# Patient Record
Sex: Male | Born: 1960 | Race: Black or African American | Hispanic: No | Smoking: Current every day smoker
Health system: Southern US, Community
[De-identification: ages and names within clinical notes are randomized; demographics above are authoritative.]

## PROBLEM LIST (undated history)

## (undated) DIAGNOSIS — M1990 Unspecified osteoarthritis, unspecified site: Secondary | ICD-10-CM

## (undated) DIAGNOSIS — E7800 Pure hypercholesterolemia, unspecified: Secondary | ICD-10-CM

## (undated) DIAGNOSIS — I10 Essential (primary) hypertension: Secondary | ICD-10-CM

## (undated) DIAGNOSIS — I639 Cerebral infarction, unspecified: Secondary | ICD-10-CM

## (undated) DIAGNOSIS — J45909 Unspecified asthma, uncomplicated: Secondary | ICD-10-CM

## (undated) DIAGNOSIS — Z8673 Personal history of transient ischemic attack (TIA), and cerebral infarction without residual deficits: Secondary | ICD-10-CM

## (undated) DIAGNOSIS — T7840XA Allergy, unspecified, initial encounter: Secondary | ICD-10-CM

## (undated) DIAGNOSIS — E119 Type 2 diabetes mellitus without complications: Secondary | ICD-10-CM

## (undated) HISTORY — DX: Allergy, unspecified, initial encounter: T78.40XA

## (undated) HISTORY — DX: Unspecified asthma, uncomplicated: J45.909

## (undated) HISTORY — PX: BACK SURGERY: SHX140

## (undated) HISTORY — DX: Essential (primary) hypertension: I10

## (undated) HISTORY — DX: Personal history of transient ischemic attack (TIA), and cerebral infarction without residual deficits: Z86.73

## (undated) HISTORY — DX: Unspecified osteoarthritis, unspecified site: M19.90

## (undated) HISTORY — PX: CYSTECTOMY: SUR359

---
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
## 2001-08-22 ENCOUNTER — Emergency Department (HOSPITAL_COMMUNITY): Admission: EM | Admit: 2001-08-22 | Discharge: 2001-08-22 | Payer: Self-pay | Admitting: Emergency Medicine

## 2001-08-22 ENCOUNTER — Encounter: Payer: Self-pay | Admitting: Emergency Medicine

## 2015-10-28 ENCOUNTER — Emergency Department (HOSPITAL_BASED_OUTPATIENT_CLINIC_OR_DEPARTMENT_OTHER): Payer: Managed Care, Other (non HMO)

## 2015-10-28 ENCOUNTER — Encounter (HOSPITAL_BASED_OUTPATIENT_CLINIC_OR_DEPARTMENT_OTHER): Payer: Self-pay | Admitting: *Deleted

## 2015-10-28 ENCOUNTER — Emergency Department (HOSPITAL_BASED_OUTPATIENT_CLINIC_OR_DEPARTMENT_OTHER)
Admission: EM | Admit: 2015-10-28 | Discharge: 2015-10-28 | Disposition: A | Discharge status: Home / Self Care | Payer: Managed Care, Other (non HMO) | Attending: Emergency Medicine | Admitting: Emergency Medicine

## 2015-10-28 DIAGNOSIS — G629 Polyneuropathy, unspecified: Secondary | ICD-10-CM | POA: Diagnosis not present

## 2015-10-28 DIAGNOSIS — Y9289 Other specified places as the place of occurrence of the external cause: Secondary | ICD-10-CM | POA: Insufficient documentation

## 2015-10-28 DIAGNOSIS — Y998 Other external cause status: Secondary | ICD-10-CM | POA: Insufficient documentation

## 2015-10-28 DIAGNOSIS — M7989 Other specified soft tissue disorders: Secondary | ICD-10-CM | POA: Diagnosis present

## 2015-10-28 DIAGNOSIS — S838X1A Sprain of other specified parts of right knee, initial encounter: Secondary | ICD-10-CM

## 2015-10-28 DIAGNOSIS — Z760 Encounter for issue of repeat prescription: Secondary | ICD-10-CM | POA: Insufficient documentation

## 2015-10-28 DIAGNOSIS — X58XXXA Exposure to other specified factors, initial encounter: Secondary | ICD-10-CM | POA: Insufficient documentation

## 2015-10-28 DIAGNOSIS — S83206A Unspecified tear of unspecified meniscus, current injury, right knee, initial encounter: Secondary | ICD-10-CM | POA: Insufficient documentation

## 2015-10-28 DIAGNOSIS — Z7984 Long term (current) use of oral hypoglycemic drugs: Secondary | ICD-10-CM | POA: Diagnosis not present

## 2015-10-28 DIAGNOSIS — E7800 Pure hypercholesterolemia, unspecified: Secondary | ICD-10-CM | POA: Insufficient documentation

## 2015-10-28 DIAGNOSIS — F17210 Nicotine dependence, cigarettes, uncomplicated: Secondary | ICD-10-CM | POA: Diagnosis not present

## 2015-10-28 DIAGNOSIS — E1140 Type 2 diabetes mellitus with diabetic neuropathy, unspecified: Secondary | ICD-10-CM | POA: Insufficient documentation

## 2015-10-28 DIAGNOSIS — Y9389 Activity, other specified: Secondary | ICD-10-CM | POA: Diagnosis not present

## 2015-10-28 DIAGNOSIS — I10 Essential (primary) hypertension: Secondary | ICD-10-CM

## 2015-10-28 HISTORY — DX: Pure hypercholesterolemia, unspecified: E78.00

## 2015-10-28 HISTORY — DX: Essential (primary) hypertension: I10

## 2015-10-28 HISTORY — DX: Type 2 diabetes mellitus without complications: E11.9

## 2015-10-28 LAB — CBC WITH DIFFERENTIAL/PLATELET
Basophils Absolute: 0 10*3/uL (ref 0.0–0.1)
Basophils Relative: 1 %
Eosinophils Absolute: 0.1 10*3/uL (ref 0.0–0.7)
Eosinophils Relative: 2 %
HCT: 38.1 % — ABNORMAL LOW (ref 39.0–52.0)
Hemoglobin: 12.7 g/dL — ABNORMAL LOW (ref 13.0–17.0)
Lymphocytes Relative: 33 %
Lymphs Abs: 2 10*3/uL (ref 0.7–4.0)
MCH: 23.7 pg — ABNORMAL LOW (ref 26.0–34.0)
MCHC: 33.3 g/dL (ref 30.0–36.0)
MCV: 71.1 fL — ABNORMAL LOW (ref 78.0–100.0)
Monocytes Absolute: 0.8 10*3/uL (ref 0.1–1.0)
Monocytes Relative: 13 %
Neutro Abs: 3.1 10*3/uL (ref 1.7–7.7)
Neutrophils Relative %: 51 %
Platelets: 275 10*3/uL (ref 150–400)
RBC: 5.36 MIL/uL (ref 4.22–5.81)
RDW: 15.2 % (ref 11.5–15.5)
WBC: 6 10*3/uL (ref 4.0–10.5)

## 2015-10-28 LAB — COMPREHENSIVE METABOLIC PANEL
ALT: 19 U/L (ref 17–63)
AST: 22 U/L (ref 15–41)
Albumin: 3.6 g/dL (ref 3.5–5.0)
Alkaline Phosphatase: 50 U/L (ref 38–126)
Anion gap: 10 (ref 5–15)
BUN: 14 mg/dL (ref 6–20)
CO2: 21 mmol/L — ABNORMAL LOW (ref 22–32)
Calcium: 8.9 mg/dL (ref 8.9–10.3)
Chloride: 105 mmol/L (ref 101–111)
Creatinine, Ser: 0.87 mg/dL (ref 0.61–1.24)
GFR calc Af Amer: >60 mL/min (ref >60)
GFR calc non Af Amer: >60 mL/min (ref >60)
Glucose, Bld: 102 mg/dL — ABNORMAL HIGH (ref 65–99)
Potassium: 4 mmol/L (ref 3.5–5.1)
Sodium: 136 mmol/L (ref 135–145)
Total Bilirubin: 0.3 mg/dL (ref 0.3–1.2)
Total Protein: 7 g/dL (ref 6.5–8.1)

## 2015-10-28 MED ORDER — AMLODIPINE BESYLATE 5 MG PO TABS: 5.0000 mg | ORAL_TABLET | Freq: Every day | ORAL | Status: DC

## 2015-10-28 MED ORDER — MELOXICAM 7.5 MG PO TABS: 7.5000 mg | ORAL_TABLET | Freq: Every day | ORAL | Status: DC

## 2015-10-28 MED ORDER — ROSUVASTATIN CALCIUM 10 MG PO TABS: 10.0000 mg | ORAL_TABLET | Freq: Every day | ORAL | Status: DC

## 2015-10-28 MED ORDER — METFORMIN HCL 500 MG PO TABS
500.0000 mg | ORAL_TABLET | Freq: Two times a day (BID) | ORAL | Status: DC
Start: 2015-10-28 — End: 2019-01-21

## 2015-10-28 MED ORDER — HYDROCODONE-ACETAMINOPHEN 5-325 MG PO TABS
1.0000 | ORAL_TABLET | Freq: Once | ORAL | Status: AC
  Administered 2015-10-28: 1 via ORAL
  Filled 2015-10-28: qty 1

## 2015-10-28 MED ORDER — GABAPENTIN 400 MG PO CAPS: 400.0000 mg | ORAL_CAPSULE | Freq: Three times a day (TID) | ORAL | Status: DC

## 2015-10-28 MED ORDER — LISINOPRIL 40 MG PO TABS: 40.0000 mg | ORAL_TABLET | Freq: Every day | ORAL | Status: DC

## 2015-10-28 NOTE — Discharge Instructions (Signed)
Hypertension Hypertension is another name for high blood pressure. High blood pressure forces your heart to work harder to pump blood. A blood pressure reading has two numbers, which includes a higher number over a lower number (example: 110/72). HOME CARE   Have your blood pressure rechecked by your doctor.  Only take medicine as told by your doctor. Follow the directions carefully. The medicine does not work as well if you skip doses. Skipping doses also puts you at risk for problems.  Do not smoke.  Monitor your blood pressure at home as told by your doctor. GET HELP IF:  You think you are having a reaction to the medicine you are taking.  You have repeat headaches or feel dizzy.  You have puffiness (swelling) in your ankles.  You have trouble with your vision. GET HELP RIGHT AWAY IF:   You get a very bad headache and are confused.  You feel weak, numb, or faint.  You get chest or belly (abdominal) pain.  You throw up (vomit).  You cannot breathe very well. MAKE SURE YOU:   Understand these instructions.  Will watch your condition.  Will get help right away if you are not doing well or get worse.   This information is not intended to replace advice given to you by your health care provider. Make sure you discuss any questions you have with your health care provider.   Document Released: 12/08/2007 Document Revised: 06/26/2013 Document Reviewed: 04/13/2013 Elsevier Interactive Patient Education 2016 Elsevier Inc.  Knee Sprain A knee sprain is a tear in the strong bands of tissue that connect the bones (ligaments) of your knee. HOME CARE  Raise (elevate) your injured knee to lessen puffiness (swelling).  To ease pain and puffiness, put ice on the injured area.  Put ice in a plastic bag.  Place a towel between your skin and the bag.  Leave the ice on for 20 minutes, 2-3 times a day.  Only take medicine as told by your doctor.  Do not leave your knee  unprotected until pain and stiffness go away (usually 4-6 weeks).  If you have a cast or splint, do not get it wet. If your doctor told you to not take it off, cover it with a plastic bag when you shower or bathe. Do not swim.  Your doctor may have you do exercises to prevent or limit permanent weakness and stiffness. GET HELP RIGHT AWAY IF:   Your cast or splint becomes damaged.  Your pain gets worse.  You have a lot of pain, puffiness, or numbness below the cast or splint. MAKE SURE YOU:   Understand these instructions.  Will watch your condition.  Will get help right away if you are not doing well or get worse.   This information is not intended to replace advice given to you by your health care provider. Make sure you discuss any questions you have with your health care provider.   Document Released: 06/09/2009 Document Revised: 06/26/2013 Document Reviewed: 02/27/2013 Elsevier Interactive Patient Education [employer]2016 Elsevier Inc.

## 2015-10-28 NOTE — ED Notes (Signed)
Swelling and pain to his left foot, ankle and lower leg. No injury. He has some swelling of his right lower leg as a result of injury 2 weeks ago when he was trotting to catch the bus.

## 2015-10-28 NOTE — ED Provider Notes (Signed)
CSN: [id number]     Arrival date & time 10/28/15  1546 History   First MD Initiated Contact with Patient 10/28/15 1606     Chief Complaint  Patient presents with  . Leg Swelling    HPI  Patient presents for evaluation complaining of a right knee pain after an injury. Swelling swelling, and pain in both legs. All Foley becomes evident that he's had a long history of high blood pressure and diabetes and peripheral neuropathy. This was not evident initially until he was able to produce old medication bottles and has been previously on Neurontin. He states he denies medicines for week after [place] here [place]. Has not been able to establish health care insurance, her primary care physician locally. Also states that he twisted his knee when attempting to run for a bus 2 weeks comes had some pain on the medial aspect of his right knee, and a lip since that time.  Past Medical History  Diagnosis Date  . High cholesterol   . Hypertension   . Diabetes mellitus without complication [facility](HCC)    Past Surgical History  Procedure Laterality Date  . Back surgery     No family history on file. Social History  Substance Use Topics  . Smoking status: Current Every Day Smoker    Types: Cigarettes  . Smokeless tobacco: None  . Alcohol Use: Yes     Comment: weekly    Review of Systems  Constitutional: Negative for fever, chills, diaphoresis, appetite change and fatigue.  HENT: Negative for mouth sores, sore throat and trouble swallowing.   Eyes: Negative for visual disturbance.  Respiratory: Negative for cough, chest tightness, shortness of breath and wheezing.   Cardiovascular: Positive for leg swelling. Negative for chest pain.  Gastrointestinal: Negative for nausea, vomiting, abdominal pain, diarrhea and abdominal distention.  Endocrine: Negative for polydipsia, polyphagia and polyuria.  Genitourinary: Negative for dysuria, frequency and hematuria.  Musculoskeletal: Positive for joint  swelling, arthralgias and gait problem.  Skin: Negative for color change, pallor and rash.  Neurological: Negative for dizziness, syncope, light-headedness and headaches.  Hematological: Does not bruise/bleed easily.  Psychiatric/Behavioral: Negative for behavioral problems and confusion.      Allergies  Review of patient's allergies indicates no known allergies.  Home Medications   Prior to Admission medications   Medication Sig Start Date End Date Taking? Authorizing Provider  amLODipine (NORVASC) 5 MG tablet Take 1 tablet (5 mg total) by mouth daily. 10/28/15   Rolland PorterMark Dayan Desa, MD  gabapentin (NEURONTIN) 400 MG capsule Take 1 capsule (400 mg total) by mouth 3 (three) times daily. 10/28/15   Rolland PorterMark Trygg Mantz, MD  lisinopril (PRINIVIL,ZESTRIL) 40 MG tablet Take 1 tablet (40 mg total) by mouth daily. 10/28/15   Rolland PorterMark Verna Hamon, MD  meloxicam (MOBIC) 7.5 MG tablet Take 1 tablet (7.5 mg total) by mouth daily. 10/28/15   Rolland PorterMark Fable Huisman, MD  metFORMIN (GLUCOPHAGE) 500 MG tablet Take 1 tablet (500 mg total) by mouth 2 (two) times daily with a meal. 10/28/15   Rolland PorterMark Jalaina Salyers, MD  rosuvastatin (CRESTOR) 10 MG tablet Take 1 tablet (10 mg total) by mouth daily. 10/28/15   Rolland PorterMark Brody Bonneau, MD   BP 104/75 mmHg  Pulse 87  Temp(Src) 98.3 F (36.8 C) (Oral)  Resp 20  Ht 5' 11.5" (1.816 m)  Wt 238 lb (107.956 kg)  BMI 32.74 kg/m2  SpO2 99% Physical Exam  Constitutional: He is oriented to person, place, and time. He appears well-developed and well-nourished. No distress.  HENT:  Head: Normocephalic.  Eyes: Conjunctivae are normal. Pupils are equal, round, and reactive to light. No scleral icterus.  Neck: Normal range of motion. Neck supple. No thyromegaly present.  Cardiovascular: Normal rate and regular rhythm.  Exam reveals no gallop and no friction rub.   No murmur heard. Pulmonary/Chest: Effort normal and breath sounds normal. No respiratory distress. He has no wheezes. He has no rales.  Abdominal: Soft. Bowel sounds are  normal. He exhibits no distension. There is no tenderness. There is no rebound.  Musculoskeletal: Normal range of motion.       Legs: Neurological: He is alert and oriented to person, place, and time.  Skin: Skin is warm and dry. No rash noted.  Psychiatric: He has a normal mood and affect. His behavior is normal.    ED Course  Procedures (including critical care time) Labs Review Labs Reviewed  CBC WITH DIFFERENTIAL/PLATELET - Abnormal; Notable for the following:    Hemoglobin 12.7 (*)    HCT 38.1 (*)    MCV 71.1 (*)    MCH 23.7 (*)    All other components within normal limits  COMPREHENSIVE METABOLIC PANEL - Abnormal; Notable for the following:    CO2 21 (*)    Glucose, Bld 102 (*)    All other components within normal limits    Imaging Review Dg Knee Complete 4 Views Right  10/28/2015  CLINICAL DATA:  The patient felt a pop in his right knee while jogging today. Initial encounter. EXAM: RIGHT KNEE - COMPLETE 4+ VIEW COMPARISON:  None. FINDINGS: There is no evidence of fracture, dislocation, or joint effusion. There is no evidence of arthropathy or other focal bone abnormality. Atherosclerotic calcification is noted. IMPRESSION: No acute abnormality. Electronically Signed   By: Drusilla Kanner M.D.   On: 10/28/2015 17:41   I have personally reviewed and evaluated these images and lab results as part of my medical decision-making.   EKG Interpretation None      MDM   Final diagnoses:  Neuropathy (HCC)  Essential hypertension  Meniscal injury, right, initial encounter    Dependent edema, possible meniscal injury. Diabetic neuropathy. Refill medications. Or so if not improving. Abdomen is tolerated.    Rolland Porter, MD 10/28/15 [phone]

## 2016-01-25 ENCOUNTER — Emergency Department (HOSPITAL_BASED_OUTPATIENT_CLINIC_OR_DEPARTMENT_OTHER): Payer: Managed Care, Other (non HMO)

## 2016-01-25 ENCOUNTER — Encounter (HOSPITAL_BASED_OUTPATIENT_CLINIC_OR_DEPARTMENT_OTHER): Payer: Self-pay | Admitting: Emergency Medicine

## 2016-01-25 ENCOUNTER — Emergency Department (HOSPITAL_BASED_OUTPATIENT_CLINIC_OR_DEPARTMENT_OTHER)
Admission: EM | Admit: 2016-01-25 | Discharge: 2016-01-25 | Disposition: A | Discharge status: Home / Self Care | Payer: Managed Care, Other (non HMO) | Attending: Emergency Medicine | Admitting: Emergency Medicine

## 2016-01-25 DIAGNOSIS — I10 Essential (primary) hypertension: Secondary | ICD-10-CM | POA: Diagnosis not present

## 2016-01-25 DIAGNOSIS — R0602 Shortness of breath: Secondary | ICD-10-CM

## 2016-01-25 DIAGNOSIS — Z7984 Long term (current) use of oral hypoglycemic drugs: Secondary | ICD-10-CM | POA: Diagnosis not present

## 2016-01-25 DIAGNOSIS — F17210 Nicotine dependence, cigarettes, uncomplicated: Secondary | ICD-10-CM | POA: Insufficient documentation

## 2016-01-25 DIAGNOSIS — Z79899 Other long term (current) drug therapy: Secondary | ICD-10-CM | POA: Insufficient documentation

## 2016-01-25 DIAGNOSIS — R0989 Other specified symptoms and signs involving the circulatory and respiratory systems: Secondary | ICD-10-CM | POA: Diagnosis present

## 2016-01-25 DIAGNOSIS — E119 Type 2 diabetes mellitus without complications: Secondary | ICD-10-CM | POA: Diagnosis not present

## 2016-01-25 LAB — BASIC METABOLIC PANEL WITH GFR
Anion gap: 10 (ref 5–15)
BUN: 15 mg/dL (ref 6–20)
CO2: 21 mmol/L — ABNORMAL LOW (ref 22–32)
Calcium: 8.5 mg/dL — ABNORMAL LOW (ref 8.9–10.3)
Chloride: 108 mmol/L (ref 101–111)
Creatinine, Ser: 1.02 mg/dL (ref 0.61–1.24)
GFR calc Af Amer: >60 mL/min
GFR calc non Af Amer: >60 mL/min
Glucose, Bld: 102 mg/dL — ABNORMAL HIGH (ref 65–99)
Potassium: 3.8 mmol/L (ref 3.5–5.1)
Sodium: 139 mmol/L (ref 135–145)

## 2016-01-25 LAB — CBC WITH DIFFERENTIAL/PLATELET
Basophils Absolute: 0.1 K/uL (ref 0.0–0.1)
Basophils Relative: 1 %
Eosinophils Absolute: 0.1 K/uL (ref 0.0–0.7)
Eosinophils Relative: 2 %
HCT: 38.5 % — ABNORMAL LOW (ref 39.0–52.0)
Hemoglobin: 12.7 g/dL — ABNORMAL LOW (ref 13.0–17.0)
Lymphocytes Relative: 46 %
Lymphs Abs: 2.8 K/uL (ref 0.7–4.0)
MCH: 23 pg — ABNORMAL LOW (ref 26.0–34.0)
MCHC: 33 g/dL (ref 30.0–36.0)
MCV: 69.7 fL — ABNORMAL LOW (ref 78.0–100.0)
Monocytes Absolute: 0.5 K/uL (ref 0.1–1.0)
Monocytes Relative: 8 %
Neutro Abs: 2.7 K/uL (ref 1.7–7.7)
Neutrophils Relative %: 43 %
Platelets: 233 K/uL (ref 150–400)
RBC: 5.52 MIL/uL (ref 4.22–5.81)
RDW: 17 % — ABNORMAL HIGH (ref 11.5–15.5)
WBC: 6.2 K/uL (ref 4.0–10.5)

## 2016-01-25 LAB — BRAIN NATRIURETIC PEPTIDE: B Natriuretic Peptide: 12.8 pg/mL (ref 0.0–100.0)

## 2016-01-25 LAB — TROPONIN I: Troponin I: <0.03 ng/mL

## 2016-01-25 MED ORDER — ALBUTEROL SULFATE HFA 108 (90 BASE) MCG/ACT IN AERS
1.0000 | INHALATION_SPRAY | Freq: Once | RESPIRATORY_TRACT | Status: AC
  Administered 2016-01-25: 2 via RESPIRATORY_TRACT
  Filled 2016-01-25: qty 6.7

## 2016-01-25 MED ORDER — ALBUTEROL SULFATE (2.5 MG/3ML) 0.083% IN NEBU
2.5000 mg | INHALATION_SOLUTION | Freq: Once | RESPIRATORY_TRACT | Status: AC
  Administered 2016-01-25: 2.5 mg via RESPIRATORY_TRACT
  Filled 2016-01-25: qty 3

## 2016-01-25 MED ORDER — PREDNISONE 10 MG (21) PO TBPK: 10.0000 mg | ORAL_TABLET | Freq: Every day | ORAL | 0 refills | Status: DC

## 2016-01-25 NOTE — ED Notes (Signed)
Pt given d/c instructions as per chart. Verbalizes understanding. No questions. Rx x 1 

## 2016-01-25 NOTE — Discharge Instructions (Signed)
I am concerned that you either have sleep apnea or undiagnosed asthma or COPD. Your blood work, chest x-ray, and EKG looked good today.  Medications: Prednisone  Treatment: Take prednisone as prescribed for 12 days. Use your albuterol inhaler every 4 hours as needed for cough, wheezing, shortness of breath.  Follow-up: Please follow-up with [facility] pulmonology for further evaluation and treatment of your symptoms. Please follow-up with your primary care provider as well. Please return to the emergency department if you develop any new or worsening symptoms.

## 2016-01-25 NOTE — ED Provider Notes (Signed)
[facility] Provider Note   CSN: [id number] Arrival date & time: 01/25/16  1501  By signing my name below, I, Phillis Haggis, attest that this documentation has been prepared under the direction and in the presence of [employer], PA-C. Electronically Signed: Phillis Haggis, ED Scribe. 01/25/16. 1:34 PM.   First Provider Contact: 5:44 PM    History   Chief Complaint Chief Complaint  Patient presents with  . Choking    The history is provided by the patient. No language interpreter was used.  HPI Comments: Gerald Wheeler is a 55 y.o. Male with a hx of DM, HTN, and childhood asthma who presents to the Emergency Department complaining of gradually worsening phlegm production onset 5 months ago, worsening last night. Pt reports that he has been choking in his sleep due to phlegm, which will wake him in the middle of the night. He reports associated lightheadedness, SOB when he is choking, and wheezing. He has been trying to cough up the phlegm but it has only brought mild relief. Wife states that pt has been coughing and will wake him from sleep. Pt has an inhaler, but believes it is making his symptoms worse. He reports worsening symptoms when he lays down. He has not tried other treatments. Wife states that pt also has intermittent bilateral leg swelling; he last had these symptoms in May. Pt is a smoker, 10 cigarettes per day. He denies recent long travel, recent surgeries, leg pain or swelling, chest pain, abdominal pain, nausea, vomiting, dysuria, hematuria, or headaches. Pt has paternal family hx of heart disease and MI at a young age.   Past Medical History:  Diagnosis Date  . Diabetes mellitus without complication (HCC)   . High cholesterol   . Hypertension     There are no active problems to display for this patient.   Past Surgical History:  Procedure Laterality Date  . BACK SURGERY       Home Medications    Prior to Admission medications   Medication  Sig Start Date End Date Taking? Authorizing Provider  amLODipine (NORVASC) 5 MG tablet Take 1 tablet (5 mg total) by mouth daily. 10/28/15  Yes Rolland Porter, MD  gabapentin (NEURONTIN) 400 MG capsule Take 1 capsule (400 mg total) by mouth 3 (three) times daily. 10/28/15  Yes Rolland Porter, MD  lisinopril (PRINIVIL,ZESTRIL) 40 MG tablet Take 1 tablet (40 mg total) by mouth daily. 10/28/15  Yes Rolland Porter, MD  meloxicam (MOBIC) 7.5 MG tablet Take 1 tablet (7.5 mg total) by mouth daily. 10/28/15  Yes Rolland Porter, MD  metFORMIN (GLUCOPHAGE) 500 MG tablet Take 1 tablet (500 mg total) by mouth 2 (two) times daily with a meal. 10/28/15  Yes Rolland Porter, MD  rosuvastatin (CRESTOR) 10 MG tablet Take 1 tablet (10 mg total) by mouth daily. 10/28/15  Yes Rolland Porter, MD  predniSONE (STERAPRED UNI-PAK 21 TAB) 10 MG (21) TBPK tablet Take 1 tablet (10 mg total) by mouth daily. Take 6 tabs by mouth daily  for 2 days, then 5 tabs for 2 days, then 4 tabs for 2 days, then 3 tabs for 2 days, 2 tabs for 2 days, then 1 tab by mouth daily for 2 days 01/25/16   Emi Holes, PA-C    Family History No family history on file.  Social History Social History  Substance Use Topics  . Smoking status: Current Every Day Smoker    Types: Cigarettes  . Smokeless tobacco: Never Used  .  Alcohol use Yes     Comment: weekly     Allergies   Review of patient's allergies indicates no known allergies.   Review of Systems Review of Systems  Constitutional: Negative for chills and fever.  HENT: Negative for facial swelling.   Respiratory: Positive for cough, shortness of breath and wheezing.   Cardiovascular: Negative for chest pain and leg swelling.  Gastrointestinal: Negative for abdominal pain, nausea and vomiting.  Genitourinary: Negative for dysuria.  Musculoskeletal: Negative for back pain.  Skin: Negative for rash and wound.  Neurological: Positive for light-headedness. Negative for headaches.  Psychiatric/Behavioral: The  patient is not nervous/anxious.      Physical Exam Updated Vital Signs BP 102/89 (BP Location: Left Arm)   Pulse 80   Temp 97.9 F (36.6 C) (Oral)   Resp 20   Ht  (1.803 m)   Wt 104.3 kg   SpO2 98%   BMI 32.08 kg/m   Physical Exam  Constitutional: He appears well-developed and well-nourished. No distress.  HENT:  Head: Normocephalic and atraumatic.  Eyes: Conjunctivae are normal. Pupils are equal, round, and reactive to light. Right eye exhibits no discharge. Left eye exhibits no discharge. No scleral icterus.  Neck: Normal range of motion. Neck supple. No thyromegaly present.  Cardiovascular: Normal rate, regular rhythm and normal heart sounds.  Exam reveals no gallop and no friction rub.   No murmur heard. Pulmonary/Chest: Effort normal. No stridor. No respiratory distress. He has wheezes. He has no rales.  Wheezes at bilateral bases  Abdominal: Soft. Bowel sounds are normal. He exhibits no distension. There is no tenderness. There is no rebound and no guarding.  Musculoskeletal: He exhibits no edema.  No peripheral edema or calf tenderness to palpation  Lymphadenopathy:    He has no cervical adenopathy.  Neurological: He is alert. Coordination normal.  Skin: Skin is warm and dry. No rash noted. He is not diaphoretic. No pallor.  Psychiatric: He has a normal mood and affect.  Nursing note and vitals reviewed.    ED Treatments / Results  DIAGNOSTIC STUDIES: Oxygen Saturation is 94% on RA, adequate by my interpretation.    COORDINATION OF CARE: 5:50 PM-Discussed treatment plan which includes labs, breathing treatment and chest x-ray with pt at bedside and pt agreed to plan.    Labs (all labs ordered are listed, but only abnormal results are displayed) Labs Reviewed  BASIC METABOLIC PANEL - Abnormal; Notable for the following:       Result Value   CO2 21 (*)    Glucose, Bld 102 (*)    Calcium 8.5 (*)    All other components within normal limits  CBC WITH  DIFFERENTIAL/PLATELET - Abnormal; Notable for the following:    Hemoglobin 12.7 (*)    HCT 38.5 (*)    MCV 69.7 (*)    MCH 23.0 (*)    RDW 17.0 (*)    All other components within normal limits  BRAIN NATRIURETIC PEPTIDE  TROPONIN I    EKG  EKG Interpretation  Date/Time:  Sunday January 25 2016 18:02:46 EDT Ventricular Rate:  84 PR Interval:    QRS Duration: 87 QT Interval:  364 QTC Calculation: 431 R Axis:   17 Text Interpretation:  Sinus rhythm Normal sinus rhythm Confirmed by Kandis Mannan ([id number]) on 01/26/2016 12:25:26 PM       Radiology Dg Chest 2 View  Result Date: 01/25/2016 CLINICAL DATA:  Patient with possible aspiration.  Choking. EXAM: CHEST  2 VIEW  COMPARISON:  None. FINDINGS: Monitoring leads overlie the patient. Normal cardiac and mediastinal contours. No consolidative pulmonary opacities. No pleural effusion or pneumothorax. Mid thoracic spine degenerative changes. IMPRESSION: No active cardiopulmonary disease. Electronically Signed   By: Annia Belt M.D.   On: 01/25/2016 18:26   Procedures Procedures (including critical care time)  Medications Ordered in ED Medications  albuterol (PROVENTIL) (2.5 MG/3ML) 0.083% nebulizer solution 2.5 mg (2.5 mg Nebulization Given 01/25/16 1820)  albuterol (PROVENTIL HFA;VENTOLIN HFA) 108 (90 Base) MCG/ACT inhaler 1-2 puff (2 puffs Inhalation Given 01/25/16 2017)     Initial Impression / Assessment and Plan / ED Course  I have reviewed the triage vital signs and the nursing notes.  Pertinent labs & imaging results that were available during my care of the patient were reviewed by me and considered in my medical decision making (see chart for details).  Clinical Course      Final Clinical Impressions(s) / ED Diagnoses   I suspect undiagnosed sleep apnea or asthma/COPD. Less likely GERD. CBC shows stable, mild anemia, BMP shows CO2 21, glucose 102, calcium 8.5. Troponin <0.03. BNP 12.8. CXR shows no active  cardiopeulmary disease. EKG shows NSR. Patient wheezes on lung exam resolved with albuterol neb in ED. O2 sats >90% throughout ED course. I will discharge patient home with prednisone taper and albuterol inhaler refill. Patient to follow up with pulmonology for further evaluation and treatment, as well as his PCP. Patient advised that untreated sleep apnea can lead to increased risk of heart disease and stroke. Return precautions discussed. Patient understands and agrees with plan. Patient vitals stable throughout ED course and discharged in satisfactory condition. I discussed patient with Dr. Clarene Duke who is in agreement with plan.  Final diagnoses:  Choking episode occurring at night  Shortness of breath   I personally performed the services described in this documentation, which was scribed in my presence. The recorded information has been reviewed and is accurate.    New Prescriptions Discharge Medication List as of 01/25/2016  7:58 PM    START taking these medications   Details  predniSONE (STERAPRED UNI-PAK 21 TAB) 10 MG (21) TBPK tablet Take 1 tablet (10 mg total) by mouth daily. Take 6 tabs by mouth daily  for 2 days, then 5 tabs for 2 days, then 4 tabs for 2 days, then 3 tabs for 2 days, 2 tabs for 2 days, then 1 tab by mouth daily for 2 days, Starting Sun 01/25/2016, Print         Emi Holes, PA-C 01/26/16 1347    Laurence Spates, MD 02/06/16 1546

## 2016-01-25 NOTE — ED Notes (Addendum)
Pt states he has been working third for about a month and has not been able to sleep well. States for the past 5 months, he is waking up feeling like he is choking on phlegm. Worse the past few days. Tries to cough it up, but is not always successful. Has heard himself wheezing and tried inhaler, but felt worse afterward. Also c/o dizziness. No distress at present. HOB elevated.

## 2016-01-25 NOTE — ED Triage Notes (Signed)
Pt reports being awakened from sleep coughing and choking for 5 months. Pt wife states it was worse last night.

## 2016-01-30 ENCOUNTER — Telehealth: Payer: Self-pay | Admitting: Medical

## 2016-01-30 ENCOUNTER — Ambulatory Visit: Payer: Managed Care, Other (non HMO) | Admitting: Medical

## 2016-02-04 NOTE — Telephone Encounter (Signed)
Pt was no show 01/30/16 for new pt appt, pt has not rescheduled, 1st no show, charge or no charge?

## 2016-02-05 NOTE — Telephone Encounter (Signed)
No charge. But not to reschedule with me since missed first appointment and no call.

## 2016-02-26 ENCOUNTER — Telehealth: Payer: Self-pay | Admitting: Behavioral Health

## 2016-02-26 NOTE — Telephone Encounter (Signed)
Unable to reach patient at time of Pre-Visit Call.  Left message for patient to return call when available.    

## 2016-02-27 ENCOUNTER — Ambulatory Visit (INDEPENDENT_AMBULATORY_CARE_PROVIDER_SITE_OTHER): Payer: Managed Care, Other (non HMO) | Admitting: Family Medicine

## 2016-02-27 ENCOUNTER — Encounter: Payer: Self-pay | Admitting: Family Medicine

## 2016-02-27 VITALS — BP 120/72 | HR 74 | Temp 98.6°F | Resp 18 | Ht 70.0 in | Wt 236.0 lb

## 2016-02-27 DIAGNOSIS — R0689 Other abnormalities of breathing: Secondary | ICD-10-CM | POA: Diagnosis not present

## 2016-02-27 DIAGNOSIS — R0989 Other specified symptoms and signs involving the circulatory and respiratory systems: Secondary | ICD-10-CM

## 2016-02-27 DIAGNOSIS — J309 Allergic rhinitis, unspecified: Secondary | ICD-10-CM

## 2016-02-27 DIAGNOSIS — Z23 Encounter for immunization: Secondary | ICD-10-CM

## 2016-02-27 DIAGNOSIS — Z1211 Encounter for screening for malignant neoplasm of colon: Secondary | ICD-10-CM | POA: Diagnosis not present

## 2016-02-27 DIAGNOSIS — I10 Essential (primary) hypertension: Secondary | ICD-10-CM | POA: Diagnosis not present

## 2016-02-27 HISTORY — DX: Essential (primary) hypertension: I10

## 2016-02-27 MED ORDER — AMLODIPINE BESYLATE 5 MG PO TABS: 5.0000 mg | ORAL_TABLET | Freq: Every day | ORAL | 0 refills | Status: DC

## 2016-02-27 NOTE — Progress Notes (Signed)
Chief Complaint  Patient presents with  . New Patient (Initial Visit)    patient c/o of nasal drainage for about a month, he report that he coughing/choking from drainage especially at night.       New Patient Visit SUBJECTIVE: HPI: Gerald Wheeler is an 55 y.o.male who is being seen for establishing care.  [facility] He is in fact still seeing a provider there.   Health maintenance history:  Complete physical: >1 year Colonoscopy: Never had Routine blood work: Has had recently HIV/Hep C screening: HIV negative. Thinks he has had Hep C screening done. Tetanus: 20 years ago Pneumonia shot: Never have had; refuses   Concerns:  Drainage in throat and runny nose when he goes outside. He does not take any daily medications for this.  No Known Allergies  Past Medical History:  Diagnosis Date  . Allergy   . Arthritis   . Asthma   . Diabetes mellitus without complication (HCC)   . Essential hypertension 02/27/2016  . High cholesterol   . History of stroke   . Hypertension    Past Surgical History:  Procedure Laterality Date  . BACK SURGERY    . CYSTECTOMY N/A    cyst removed about 30 years ago  . TONSILLECTOMY  1973   Social History   Social History  . Marital status: Married   Social History Main Topics  . Smoking status: Current Every Day Smoker    Types: Cigarettes  . Smokeless tobacco: Never Used  . Alcohol use Yes     Comment: weekly  . Drug use: No   Family History  Problem Relation Age of Onset  . Heart disease Mother   . High blood pressure Mother   . Heart disease Father   . Diabetes Father      Current Outpatient Prescriptions:  .  amLODipine (NORVASC) 5 MG tablet, Take 1 tablet (5 mg total) by mouth daily., Disp: 30 tablet, Rfl: 0 .  gabapentin (NEURONTIN) 400 MG capsule, Take 1 capsule (400 mg total) by mouth 3 (three) times daily., Disp: 120 capsule, Rfl: 0 .  lisinopril (PRINIVIL,ZESTRIL)  40 MG tablet, Take 1 tablet (40 mg total) by mouth daily., Disp: 30 tablet, Rfl: 0 .  meloxicam (MOBIC) 7.5 MG tablet, Take 1 tablet (7.5 mg total) by mouth daily., Disp: 30 tablet, Rfl: 0 .  metFORMIN (GLUCOPHAGE) 500 MG tablet, Take 1 tablet (500 mg total) by mouth 2 (two) times daily with a meal., Disp: 30 tablet, Rfl: 0 .  rosuvastatin (CRESTOR) 10 MG tablet, Take 1 tablet (10 mg total) by mouth daily., Disp: 30 tablet, Rfl: 0 .  predniSONE (STERAPRED UNI-PAK 21 TAB) 10 MG (21) TBPK tablet, Take 1 tablet (10 mg total) by mouth daily. Take 6 tabs by mouth daily  for 2 days, then 5 tabs for 2 days, then 4 tabs for 2 days, then 3 tabs for 2 days, 2 tabs for 2 days, then 1 tab by mouth daily for 2 days (Patient not taking: Reported on 02/27/2016), Disp: 42 tablet, Rfl: 0  ROS Consitutional: Denies fevers, chills  HEENT: +Nasal drainage  Respiratory: Denies dyspnea, +cough   OBJECTIVE: BP 120/72 (BP Location: Right Arm, Patient Position: Sitting, Cuff Size: Normal)   Pulse 74   Temp 98.6 F (37 C) (Oral)   Resp 18   Ht 5\' 10"  (1.778 m)   Wt 236 lb (107 kg)   SpO2 97%  BMI 33.86 kg/m   Constitutional: -  VS reviewed -  Well developed, well nourished, appears stated age -  No apparent distress  Psychiatric: -  Oriented to person, place, and time -  Memory intact -  Affect and mood normal -  Fluent conversation, good eye contact -  Judgment and insight age appropriate  Eye: -  Conjunctivae clear, no discharge -  Pupils symmetric, round, reactive to light  ENMT: -  Ears are patent b/l without erythema or discharge. TM's are shiny and clear b/l without evidence of effusion or infection. -  Oral mucosa without lesions, tongue and uvula midline    Tonsils not enlarged, no erythema, no exudate, trachea midline    Pharynx moist, no lesions, no erythema  Neck: -  No gross swelling, no palpable masses -  Thyroid midline, not enlarged, mobile, no palpable masses  Cardiovascular: -  RRR, no  murmurs -  +1 pitting edema b/l up to mid tibia b/l  Respiratory: -  Normal respiratory effort, no accessory muscle use, no retraction -  Breath sounds equal, no wheezes, no ronchi, no crackles  Musculoskeletal: -  No clubbing, no cyanosis -  Gait normal  Skin: -  No significant lesion on inspection -  Warm and dry to palpation   ASSESSMENT/PLAN: Choking episode occurring at night - Plan: Ambulatory referral to Pulmonology  Allergic rhinitis, unspecified allergic rhinitis type  Screen for colon cancer - Plan: Ambulatory referral to Gastroenterology  Essential hypertension - Plan: amLODipine (NORVASC) 5 MG tablet  Need for vaccine for TD (tetanus-diphtheria) - Plan: Td : Tetanus/diphtheria >7yo Preservative  free  Need for 23-polyvalent pneumococcal polysaccharide vaccine - Plan: Pneumococcal polysaccharide vaccine 23-valent greater than or equal to 2yo subcutaneous/IM  Orders as above. Gave information regarding OTC anti-allergy options. PND is likely the cause of his choking episodes, but will get a sleep study to be sure. Patient should return after his upcoming appt at his other office if he would like to receive care here. I will see him for a DM visit. The patient voiced understanding and agreement to the plan.   Jilda RocheNicholas Paul BuellWendling

## 2016-02-27 NOTE — Progress Notes (Signed)
Pre visit review using our clinic review tool, if applicable. No additional management support is needed unless otherwise documented below in the visit note. 

## 2016-02-27 NOTE — Patient Instructions (Addendum)
Claritin (loratadine), Allegra (fexofenadine), Zyrtec (cetirizine)  Flonase (fluticasone)  There are available OTC, and the generic versions, which may be cheaper, are in parentheses. Show this to a pharmacist if you have trouble finding any of these items.  Let us know if you would like to receive your care in our office our at the community health center. Schedule an appt in 4-6 weeks if you would like to come here for a diabetic visit.

## 2016-03-03 ENCOUNTER — Encounter: Payer: Self-pay | Admitting: Internal Medicine

## 2016-05-07 ENCOUNTER — Encounter: Payer: Managed Care, Other (non HMO) | Admitting: Internal Medicine

## 2016-10-19 ENCOUNTER — Encounter (HOSPITAL_BASED_OUTPATIENT_CLINIC_OR_DEPARTMENT_OTHER): Payer: Self-pay

## 2016-10-19 ENCOUNTER — Emergency Department (HOSPITAL_BASED_OUTPATIENT_CLINIC_OR_DEPARTMENT_OTHER): Payer: Self-pay

## 2016-10-19 ENCOUNTER — Inpatient Hospital Stay (HOSPITAL_BASED_OUTPATIENT_CLINIC_OR_DEPARTMENT_OTHER)
Admission: EM | Admit: 2016-10-19 | Discharge: 2016-10-26 | DRG: 163 | Disposition: A | Discharge status: Home / Self Care | Payer: Self-pay | Attending: Internal Medicine | Admitting: Internal Medicine

## 2016-10-19 DIAGNOSIS — D509 Iron deficiency anemia, unspecified: Secondary | ICD-10-CM | POA: Diagnosis present

## 2016-10-19 DIAGNOSIS — Z8673 Personal history of transient ischemic attack (TIA), and cerebral infarction without residual deficits: Secondary | ICD-10-CM

## 2016-10-19 DIAGNOSIS — J90 Pleural effusion, not elsewhere classified: Secondary | ICD-10-CM

## 2016-10-19 DIAGNOSIS — J869 Pyothorax without fistula: Secondary | ICD-10-CM

## 2016-10-19 DIAGNOSIS — J9601 Acute respiratory failure with hypoxia: Secondary | ICD-10-CM | POA: Diagnosis present

## 2016-10-19 DIAGNOSIS — Z79899 Other long term (current) drug therapy: Secondary | ICD-10-CM

## 2016-10-19 DIAGNOSIS — J9811 Atelectasis: Secondary | ICD-10-CM | POA: Diagnosis present

## 2016-10-19 DIAGNOSIS — I10 Essential (primary) hypertension: Secondary | ICD-10-CM | POA: Diagnosis present

## 2016-10-19 DIAGNOSIS — D72829 Elevated white blood cell count, unspecified: Secondary | ICD-10-CM | POA: Diagnosis present

## 2016-10-19 DIAGNOSIS — E860 Dehydration: Secondary | ICD-10-CM | POA: Diagnosis present

## 2016-10-19 DIAGNOSIS — Z720 Tobacco use: Secondary | ICD-10-CM

## 2016-10-19 DIAGNOSIS — F17210 Nicotine dependence, cigarettes, uncomplicated: Secondary | ICD-10-CM | POA: Diagnosis present

## 2016-10-19 DIAGNOSIS — M1990 Unspecified osteoarthritis, unspecified site: Secondary | ICD-10-CM | POA: Diagnosis present

## 2016-10-19 DIAGNOSIS — J851 Abscess of lung with pneumonia: Principal | ICD-10-CM | POA: Diagnosis present

## 2016-10-19 DIAGNOSIS — Z7984 Long term (current) use of oral hypoglycemic drugs: Secondary | ICD-10-CM

## 2016-10-19 DIAGNOSIS — J189 Pneumonia, unspecified organism: Secondary | ICD-10-CM

## 2016-10-19 DIAGNOSIS — E785 Hyperlipidemia, unspecified: Secondary | ICD-10-CM | POA: Diagnosis present

## 2016-10-19 DIAGNOSIS — R0600 Dyspnea, unspecified: Secondary | ICD-10-CM

## 2016-10-19 DIAGNOSIS — J939 Pneumothorax, unspecified: Secondary | ICD-10-CM | POA: Diagnosis present

## 2016-10-19 DIAGNOSIS — E7800 Pure hypercholesterolemia, unspecified: Secondary | ICD-10-CM | POA: Diagnosis present

## 2016-10-19 DIAGNOSIS — J181 Lobar pneumonia, unspecified organism: Secondary | ICD-10-CM | POA: Diagnosis present

## 2016-10-19 DIAGNOSIS — J45909 Unspecified asthma, uncomplicated: Secondary | ICD-10-CM | POA: Diagnosis present

## 2016-10-19 DIAGNOSIS — R1013 Epigastric pain: Secondary | ICD-10-CM | POA: Diagnosis not present

## 2016-10-19 DIAGNOSIS — R066 Hiccough: Secondary | ICD-10-CM | POA: Diagnosis not present

## 2016-10-19 DIAGNOSIS — J918 Pleural effusion in other conditions classified elsewhere: Secondary | ICD-10-CM | POA: Diagnosis present

## 2016-10-19 DIAGNOSIS — E119 Type 2 diabetes mellitus without complications: Secondary | ICD-10-CM | POA: Diagnosis present

## 2016-10-19 DIAGNOSIS — Z9689 Presence of other specified functional implants: Secondary | ICD-10-CM

## 2016-10-19 DIAGNOSIS — E871 Hypo-osmolality and hyponatremia: Secondary | ICD-10-CM | POA: Diagnosis present

## 2016-10-19 LAB — COMPREHENSIVE METABOLIC PANEL
ALBUMIN: 3.3 g/dL — AB (ref 3.5–5.0)
ALT: 20 U/L (ref 17–63)
ANION GAP: 10 (ref 5–15)
AST: 24 U/L (ref 15–41)
Alkaline Phosphatase: 59 U/L (ref 38–126)
BILIRUBIN TOTAL: 0.4 mg/dL (ref 0.3–1.2)
BUN: 7 mg/dL (ref 6–20)
CO2: 23 mmol/L (ref 22–32)
Calcium: 9.2 mg/dL (ref 8.9–10.3)
Chloride: 99 mmol/L — ABNORMAL LOW (ref 101–111)
Creatinine, Ser: 0.79 mg/dL (ref 0.61–1.24)
GFR calc Af Amer: >60 mL/min (ref >60)
GFR calc non Af Amer: >60 mL/min (ref >60)
Glucose, Bld: 118 mg/dL — ABNORMAL HIGH (ref 65–99)
POTASSIUM: 4.5 mmol/L (ref 3.5–5.1)
SODIUM: 132 mmol/L — AB (ref 135–145)
TOTAL PROTEIN: 7.6 g/dL (ref 6.5–8.1)

## 2016-10-19 LAB — GLUCOSE, CAPILLARY: GLUCOSE-CAPILLARY: 123 mg/dL — AB (ref 65–99)

## 2016-10-19 LAB — LIPASE, BLOOD: LIPASE: 13 U/L (ref 11–51)

## 2016-10-19 LAB — CBC
HCT: 38.6 % — ABNORMAL LOW (ref 39.0–52.0)
Hemoglobin: 12.9 g/dL — ABNORMAL LOW (ref 13.0–17.0)
MCH: 23.9 pg — ABNORMAL LOW (ref 26.0–34.0)
MCHC: 33.4 g/dL (ref 30.0–36.0)
MCV: 71.5 fL — ABNORMAL LOW (ref 78.0–100.0)
PLATELETS: 346 10*3/uL (ref 150–400)
RBC: 5.4 MIL/uL (ref 4.22–5.81)
RDW: 15 % (ref 11.5–15.5)
WBC: 11.4 10*3/uL — AB (ref 4.0–10.5)

## 2016-10-19 LAB — TROPONIN I: Troponin I: <0.03 ng/mL (ref <0.03)

## 2016-10-19 LAB — I-STAT CG4 LACTIC ACID, ED: LACTIC ACID, VENOUS: 1.06 mmol/L (ref 0.5–1.9)

## 2016-10-19 MED ORDER — SODIUM CHLORIDE 0.9 % IV BOLUS (SEPSIS)
1000.0000 mL | Freq: Once | INTRAVENOUS | Status: AC
  Administered 2016-10-19: 1000 mL via INTRAVENOUS

## 2016-10-19 MED ORDER — ALBUTEROL SULFATE (2.5 MG/3ML) 0.083% IN NEBU
5.0000 mg | INHALATION_SOLUTION | Freq: Once | RESPIRATORY_TRACT | Status: AC
  Administered 2016-10-19: 5 mg via RESPIRATORY_TRACT
  Filled 2016-10-19: qty 6

## 2016-10-19 MED ORDER — METHYLPREDNISOLONE SODIUM SUCC 40 MG IJ SOLR
40.0000 mg | Freq: Two times a day (BID) | INTRAMUSCULAR | Status: DC
  Administered 2016-10-19 – 2016-10-21 (×5): 40 mg via INTRAVENOUS
  Filled 2016-10-19 (×5): qty 1

## 2016-10-19 MED ORDER — IPRATROPIUM-ALBUTEROL 0.5-2.5 (3) MG/3ML IN SOLN: 3.0000 mL | RESPIRATORY_TRACT | Status: DC | PRN

## 2016-10-19 MED ORDER — LORATADINE 10 MG PO TABS
10.0000 mg | ORAL_TABLET | Freq: Every day | ORAL | Status: DC
  Administered 2016-10-20 – 2016-10-21 (×2): 10 mg via ORAL
  Filled 2016-10-19 (×5): qty 1

## 2016-10-19 MED ORDER — AZITHROMYCIN 500 MG IV SOLR
INTRAVENOUS | Status: AC
  Filled 2016-10-19: qty 500

## 2016-10-19 MED ORDER — GABAPENTIN 400 MG PO CAPS
400.0000 mg | ORAL_CAPSULE | Freq: Three times a day (TID) | ORAL | Status: DC
  Administered 2016-10-19 – 2016-10-21 (×7): 400 mg via ORAL
  Filled 2016-10-19 (×8): qty 1

## 2016-10-19 MED ORDER — SODIUM CHLORIDE 0.9 % IV SOLN
INTRAVENOUS | Status: DC
  Administered 2016-10-19 – 2016-10-21 (×5): via INTRAVENOUS

## 2016-10-19 MED ORDER — INSULIN ASPART 100 UNIT/ML ~~LOC~~ SOLN
0.0000 [IU] | Freq: Three times a day (TID) | SUBCUTANEOUS | Status: DC
  Administered 2016-10-20 – 2016-10-21 (×3): 1 [IU] via SUBCUTANEOUS
  Administered 2016-10-21: 2 [IU] via SUBCUTANEOUS

## 2016-10-19 MED ORDER — METOPROLOL TARTRATE 25 MG PO TABS
25.0000 mg | ORAL_TABLET | Freq: Every day | ORAL | Status: DC
  Administered 2016-10-20 – 2016-10-21 (×2): 25 mg via ORAL
  Filled 2016-10-19 (×5): qty 1

## 2016-10-19 MED ORDER — DEXTROSE 5 % IV SOLN
1.0000 g | Freq: Once | INTRAVENOUS | Status: AC
  Administered 2016-10-19: 1 g via INTRAVENOUS
  Filled 2016-10-19: qty 10

## 2016-10-19 MED ORDER — ENOXAPARIN SODIUM 40 MG/0.4ML ~~LOC~~ SOLN
40.0000 mg | SUBCUTANEOUS | Status: DC
  Administered 2016-10-19 – 2016-10-21 (×3): 40 mg via SUBCUTANEOUS
  Filled 2016-10-19 (×3): qty 0.4

## 2016-10-19 MED ORDER — AMLODIPINE BESYLATE 5 MG PO TABS
5.0000 mg | ORAL_TABLET | Freq: Every day | ORAL | Status: DC
  Administered 2016-10-21: 5 mg via ORAL
  Filled 2016-10-19 (×5): qty 1

## 2016-10-19 MED ORDER — LISINOPRIL 40 MG PO TABS
40.0000 mg | ORAL_TABLET | Freq: Every day | ORAL | Status: DC
  Administered 2016-10-21: 40 mg via ORAL
  Filled 2016-10-19 (×4): qty 2
  Filled 2016-10-19: qty 4

## 2016-10-19 MED ORDER — NICOTINE 7 MG/24HR TD PT24
7.0000 mg | MEDICATED_PATCH | Freq: Every day | TRANSDERMAL | Status: DC
  Administered 2016-10-20 – 2016-10-26 (×7): 7 mg via TRANSDERMAL
  Filled 2016-10-19 (×9): qty 1

## 2016-10-19 MED ORDER — DEXTROSE 5 % IV SOLN
1.0000 g | INTRAVENOUS | Status: DC
  Administered 2016-10-20 – 2016-10-25 (×5): 1 g via INTRAVENOUS
  Filled 2016-10-19 (×6): qty 10

## 2016-10-19 MED ORDER — MORPHINE SULFATE (PF) 4 MG/ML IV SOLN
4.0000 mg | Freq: Once | INTRAVENOUS | Status: AC
  Administered 2016-10-19: 4 mg via INTRAVENOUS
  Filled 2016-10-19: qty 1

## 2016-10-19 MED ORDER — DEXTROSE 5 % IV SOLN
500.0000 mg | INTRAVENOUS | Status: DC
  Administered 2016-10-20 – 2016-10-23 (×4): 500 mg via INTRAVENOUS
  Filled 2016-10-19 (×5): qty 500

## 2016-10-19 MED ORDER — IPRATROPIUM-ALBUTEROL 0.5-2.5 (3) MG/3ML IN SOLN
3.0000 mL | Freq: Four times a day (QID) | RESPIRATORY_TRACT | Status: DC
  Administered 2016-10-19: 3 mL via RESPIRATORY_TRACT
  Filled 2016-10-19: qty 3

## 2016-10-19 MED ORDER — DEXTROSE 5 % IV SOLN
500.0000 mg | Freq: Once | INTRAVENOUS | Status: AC
  Administered 2016-10-19: 500 mg via INTRAVENOUS

## 2016-10-19 MED ORDER — IPRATROPIUM-ALBUTEROL 0.5-2.5 (3) MG/3ML IN SOLN
3.0000 mL | Freq: Four times a day (QID) | RESPIRATORY_TRACT | Status: DC
  Administered 2016-10-20 – 2016-10-25 (×17): 3 mL via RESPIRATORY_TRACT
  Filled 2016-10-19 (×18): qty 3

## 2016-10-19 MED ORDER — SODIUM CHLORIDE 3 % IN NEBU
4.0000 mL | INHALATION_SOLUTION | Freq: Every day | RESPIRATORY_TRACT | Status: DC
  Administered 2016-10-20 – 2016-10-21 (×2): 4 mL via RESPIRATORY_TRACT
  Filled 2016-10-19 (×2): qty 4

## 2016-10-19 MED ORDER — PRAVASTATIN SODIUM 20 MG PO TABS
20.0000 mg | ORAL_TABLET | Freq: Every day | ORAL | Status: DC
  Administered 2016-10-20 – 2016-10-21 (×2): 20 mg via ORAL
  Filled 2016-10-19 (×3): qty 1

## 2016-10-19 NOTE — H&P (Signed)
History and Physical    Gerald Wheeler:[id number] DOB: 1960/10/13 DOA: 10/19/2016  PCP: [name], FNP   Patient coming from: Home via [facility]  Chief Complaint: Shortness of breath and Cough  HPI: Gerald Wheeler is a 56 y.o. male with medical history significant of HTN, HLD, Diabetes Mellitus Type 2, Asthma and Allergic Rhinitis, Arthritis and other comorbids who presented to [facility] for SOB and Cough. Patient states he has had nocturnal coughing going on for several weeks now and yesterday night he started coughing bringing up yellowish sputum. Overnight he became severely dyspenic and started coughing so hard he thought he broke a rib. Presented to [facility] and was found to have a Right Sided PNA and Right Sided Pleural Effusion and TRH was called for admission and patient was transferred to [facility] for further evaluation and admission. Upon talking with the patient he is having significant Right sided pleurtic chest pain and feels SOB and has been feeling more SOB over the last week. States it feels like a cracked rib and was severe 10/10. No nausea or vomiting. Patient states symptoms worsened overnight and has not had fevers but has been having Night sweats. No lightheadedness or dizziness. He was seen by his PCP yesterday and given cough suppressants.   ED Course: Had Labwork and Imaging. Patient was given Rocephin and Azithromycin in the ED. Transferred to [facility] for Admission  Review of Systems: As per HPI otherwise 10 point review of systems negative. No weight loss or weight gain. States CP is only on the Right Side and has no Left sided CP.   Past Medical History:  Diagnosis Date  . Allergy   . Arthritis   . Asthma   . Diabetes mellitus without complication (HCC)   . Essential hypertension 02/27/2016  . High cholesterol   . History of stroke   . Hypertension    Past Surgical History:  Procedure Laterality Date  . BACK  SURGERY    . CYSTECTOMY N/A    cyst removed about 30 years ago  . TONSILLECTOMY  1973   SOCIAL HISTORY  reports that he has been smoking Cigarettes.  He has never used smokeless tobacco. He reports that he drinks alcohol. He reports that he does not use drugs.  No Known Allergies  Family History  Problem Relation Age of Onset  . Heart disease Mother   . High blood pressure Mother   . Heart disease Father   . Diabetes Father     Prior to Admission medications   Medication Sig Start Date End Date Taking? Authorizing Provider  amLODipine (NORVASC) 5 MG tablet Take 1 tablet (5 mg total) by mouth daily. 02/27/16  Yes Jilda Roche Wendling, DO  benzonatate (TESSALON) 100 MG capsule Take 100 mg by mouth 3 (three) times daily as needed for cough.   Yes Historical Provider, MD  gabapentin (NEURONTIN) 400 MG capsule Take 1 capsule (400 mg total) by mouth 3 (three) times daily. 10/28/15  Yes Rolland Porter, MD  lisinopril (PRINIVIL,ZESTRIL) 40 MG tablet Take 1 tablet (40 mg total) by mouth daily. 10/28/15  Yes Rolland Porter, MD  loratadine (CLARITIN) 10 MG tablet Take 10 mg by mouth daily.   Yes Historical Provider, MD  lovastatin (MEVACOR) 20 MG tablet Take 20 mg by mouth at bedtime.   Yes Historical Provider, MD  meloxicam (MOBIC) 15 MG tablet Take 15 mg by mouth daily as needed for pain.   Yes  Historical Provider, MD  metFORMIN (GLUCOPHAGE) 500 MG tablet Take 1 tablet (500 mg total) by mouth 2 (two) times daily with a meal. 10/28/15  Yes Rolland Porter, MD  metoprolol tartrate (LOPRESSOR) 25 MG tablet Take 25 mg by mouth daily.   Yes Historical Provider, MD   Physical Exam: Vitals:   10/19/16 1310 10/19/16 1430 10/19/16 1600 10/19/16 1745  BP:  (!) 105/92 99/78 107/85  Pulse: 93 86 85 99  Resp: (!) 22 20 (!) 22 (!) 22  Temp:    98.3 F (36.8 C)  TempSrc:    Oral  SpO2: 96% 94% 93% 93%  Weight:    108.9 kg (240 lb)  Height:     (1.778 m)   Constitutional: WN/WD, Mild Respiratory distress  but calm Eyes: Llids and conjunctivae normal, sclerae anicteric  ENMT: External Ears, Nose appear normal. Grossly normal hearing. Mucous membranes are moist.  Neck: Appears normal, supple, no cervical masses, normal ROM, no appreciable thyromegaly, no JVD Respiratory: Diminished to auscultation especially on Right Side with rhonchi. Some bilateral wheezing noted. Increased respiratory effort and patient. No accessory muscle use. Patient wearing supplemental O2 via [place] Cardiovascular: Slightly tachycardic but regular rate, no murmurs / rubs / gallops. S1 and S2 auscultated. No extremity edema. 2+ pedal pulses.   Abdomen: Soft, non-tender, non-distended. No masses palpated. No appreciable hepatosplenomegaly. Bowel sounds positive x4.  GU: Deferred. Musculoskeletal: No clubbing / cyanosis of digits/nails. No joint deformity upper and lower extremities Skin: No rashes, lesions, ulcers on limited skin evaluation. No induration; Warm and dry.  Neurologic: CN 2-12 grossly intact with no focal deficits. Sensation intact in all 4 Extremities. Romberg sign cerebellar reflexes not assessed.  Psychiatric: Normal judgment and insight. Alert and oriented x 3. Normal mood and appropriate affect.   Labs on Admission: I have personally reviewed following labs and imaging studies  CBC:  Recent Labs Lab 10/19/16 1258  WBC 11.4*  HGB 12.9*  HCT 38.6*  MCV 71.5*  PLT 346   Basic Metabolic Panel:  Recent Labs Lab 10/19/16 1258  NA 132*  K 4.5  CL 99*  CO2 23  GLUCOSE 118*  BUN 7  CREATININE 0.79  CALCIUM 9.2   GFR: Estimated Creatinine Clearance: 129 mL/min (by C-G formula based on SCr of 0.79 mg/dL). Liver Function Tests:  Recent Labs Lab 10/19/16 1258  AST 24  ALT 20  ALKPHOS 59  BILITOT 0.4  PROT 7.6  ALBUMIN 3.3*    Recent Labs Lab 10/19/16 1258  LIPASE 13   No results for input(s): AMMONIA in the last 168 hours. Coagulation Profile: No results for input(s): INR, PROTIME in  the last 168 hours. Cardiac Enzymes:  Recent Labs Lab 10/19/16 1258  TROPONINI <0.03   BNP (last 3 results) No results for input(s): PROBNP in the last 8760 hours. HbA1C: No results for input(s): HGBA1C in the last 72 hours. CBG: No results for input(s): GLUCAP in the last 168 hours. Lipid Profile: No results for input(s): CHOL, HDL, LDLCALC, TRIG, CHOLHDL, LDLDIRECT in the last 72 hours. Thyroid Function Tests: No results for input(s): TSH, T4TOTAL, FREET4, T3FREE, THYROIDAB in the last 72 hours. Anemia Panel: No results for input(s): VITAMINB12, FOLATE, FERRITIN, TIBC, IRON, RETICCTPCT in the last 72 hours. Urine analysis: No results found for: COLORURINE, APPEARANCEUR, LABSPEC, PHURINE, GLUCOSEU, HGBUR, BILIRUBINUR, KETONESUR, PROTEINUR, UROBILINOGEN, NITRITE, LEUKOCYTESUR Sepsis Labs: !!!!!!!!!!!!!!!!!!!!!!!!!!!!!!!!!!!!!!!!!!!! (procalcitonin:4,lacticidven:4) )No results found for this or any previous visit (from the past 240 hour(s)).   Radiological Exams  on Admission: Dg Chest 2 View  Result Date: 10/19/2016 CLINICAL DATA:  Cough, congestion, shortness of breath EXAM: CHEST  2 VIEW COMPARISON:  01/25/2016 FINDINGS: New small to moderate right pleural effusion with right lower lung collapse/consolidation. Appearance is compatible with right basilar pneumonia and parapneumonic effusion. Minor left base atelectasis. Upper lobes remain clear. Normal heart size and vascularity. Trachea is midline. Normal bowel gas pattern. Minor thoracic spondylosis. IMPRESSION: Right lower lung consolidation and small to moderate right pleural effusion compatible with pneumonia, as above. Recommend radiographic follow-up to document resolution. Electronically Signed   By: Judie Petit.  Shick M.D.   On: 10/19/2016 13:28   EKG: Independently reviewed. Showed Sinus Rhythm with a rate of 93. No evidence of ST Elevation or Depression but showed some Twave Inversion in Lead III and V1 on my  interpretation.   Assessment/Plan Principal Problem:   Right lower lobe pneumonia (HCC) Active Problems:   Essential hypertension   PNA (pneumonia)   Community acquired pneumonia   Hyperlipidemia   Tobacco abuse   Diabetes mellitus type 2, noninsulin dependent (HCC)   Leukocytosis   Hyponatremia   Microcytic anemia   Acute respiratory failure with hypoxia (HCC)   Pleural effusion on right  Acute Respiratory Failure with Hypoxia 2/2 to Community Acquired Right Lower Lung PNA and Pleural Effusion -Admit to Telemetry -Continue Supplemental O2 and Continuous Pulse Oximetry -Wean O2 As Tolerated and Maintain Saturations >92% -Obtain Walk Screen at D/C  Right Sided Pneumonia with associated Pleural Effusion -Admit to Inpatient Telemetry -Place on Droplet Precautions -Check Respiratory Virus Panel, Strep Urine and Legionella Ag, Sputum Cx -Check Blood Cx's x2 -IVF with NS at 100 mL/hr -C/w Empiric Abx with IV Ceftriaxone and Azithormycin -C/w Nebs with DuoNebs 3 mL q6h and q2hprn -Added Hypertonic Saline Nebs, and Mucinex DM -Add Steroids 40 mg IV Solumedrol q12h -Repeat CXR in AM -Thoracentesis in AM -If not improving or worsens consider Chest CT  -LA was wnL at 1.06 -WBC was mildly elevated at 11.4; Repeat CBC in AM  Hypertension -C/w Home Amlodipine 5 mg po Daily, Metoprolol 25 mg po Daily, Lisinopril 40 mg po Daily  Hyperlipidemia -Check Lipid Panel -C/w Lovastatin 20 mg qHS  Tobacco Abuse -Smoking Cessation Counseling Provided -Nicotine Patch 7 mg TD  Diabetes Mellitus Type 2 -Hold Home Metformin -Place on Novolog Sensitive SSI -Obtain HbA1c -Monitor CBG's  Hyponatremia -C/w IVF at 100 mL/hr -Repeat CMP in AM  Mild Leukocytosis -WBC was mildly elevated at 11.4 -Repeat CBC in AM  Microcytic Anemia -Patient's Hb/Hct was 12.9/38.6; MCV was 71.5 -Repeat CBC in AM and Monitor for S/Sx of Bleeding  DVT prophylaxis: Lovenox 40 mg sq q24h Code Status:  FULL  CODE Family Communication: No Family present at bedside Disposition Plan: Home at D/C when Medically Stable Consults called: None Admission status: Inpatient Telemetry  [facility], D.O. [facility] Pager [phone]  If 7PM-7AM, please contact night-coverage [email or web address] Password [facility]  10/19/2016, 7:13 PM

## 2016-10-19 NOTE — Progress Notes (Signed)
Patient presents to ED with cough, Dyspnea. Stable to med-surgery. Might need thoracentesis for Pleural effusion.  Gerald Ghuman, Md.

## 2016-10-19 NOTE — ED Provider Notes (Signed)
Patient seen/examined in the Emergency Department in conjunction with Midlevel Provider [facility] Patient reports cough and difficulty breathing Exam : awake/alert, mild tachypnea and pleuritic CP reported Plan: pt with pneumonia with associated effusion Will treat with IV fluids/antibiotics/albuterol If he is still tachypneic or difficulty walking/hypoxic he will need admission     Zadie Rhine, MD 10/19/16 1453

## 2016-10-19 NOTE — ED Notes (Signed)
Pt complaint of soreness to RUQ and spasm to right chest.  Moist non production cough noted.

## 2016-10-19 NOTE — ED Provider Notes (Signed)
[facility] Provider Note   CSN: [id number] Arrival date & time: 10/19/16  1238     History   Chief Complaint Chief Complaint  Patient presents with  . Cough    HPI Gerald Wheeler is a 56 y.o. male.  HPI  56 y.o. male with a hx of HTN, DM, HLD, presents to the Emergency Department today complaining of right sided chest pain as well as cough shortness of breath x 1 week. Seen by PCP yesterday and Dx Viral URI and given cough suppressant medications. Pt states that this morning he coughed very hard and felt a pop in his anterior right chest wall. Notes pain with inspiration. Coughing persists and is productive. No N/V/D. No radiation of pain in arms. No hx ACS. No fevers. No hx DVT/PE. No recent travel. No recent surgeries. No recent hospitalizations. No other symptoms noted    Past Medical History:  Diagnosis Date  . Allergy   . Arthritis   . Asthma   . Diabetes mellitus without complication (HCC)   . Essential hypertension 02/27/2016  . High cholesterol   . History of stroke   . Hypertension     Patient Active Problem List   Diagnosis Date Noted  . Essential hypertension 02/27/2016    Past Surgical History:  Procedure Laterality Date  . BACK SURGERY    . CYSTECTOMY N/A    cyst removed about 30 years ago  . TONSILLECTOMY  1973       Home Medications    Prior to Admission medications   Medication Sig Start Date End Date Taking? Authorizing Provider  amLODipine (NORVASC) 5 MG tablet Take 1 tablet (5 mg total) by mouth daily. 02/27/16  Yes Jilda Roche [place], DO  benzonatate (TESSALON) 100 MG capsule Take 100 mg by mouth 3 (three) times daily as needed for cough.   Yes Historical Provider, MD  gabapentin (NEURONTIN) 400 MG capsule Take 1 capsule (400 mg total) by mouth 3 (three) times daily. 10/28/15  Yes Rolland Porter, MD  lisinopril (PRINIVIL,ZESTRIL) 40 MG tablet Take 1 tablet (40 mg total) by mouth daily. 10/28/15  Yes Rolland Porter, MD  loratadine  (CLARITIN) 10 MG tablet Take 10 mg by mouth daily.   Yes Historical Provider, MD  lovastatin (MEVACOR) 20 MG tablet Take 20 mg by mouth at bedtime.   Yes Historical Provider, MD  meloxicam (MOBIC) 15 MG tablet Take 15 mg by mouth daily as needed for pain.   Yes Historical Provider, MD  metFORMIN (GLUCOPHAGE) 500 MG tablet Take 1 tablet (500 mg total) by mouth 2 (two) times daily with a meal. 10/28/15  Yes Rolland Porter, MD  metoprolol tartrate (LOPRESSOR) 25 MG tablet Take 25 mg by mouth daily.   Yes Historical Provider, MD    Family History Family History  Problem Relation Age of Onset  . Heart disease Mother   . High blood pressure Mother   . Heart disease Father   . Diabetes Father     Social History Social History  Substance Use Topics  . Smoking status: Current Every Day Smoker    Types: Cigarettes  . Smokeless tobacco: Never Used  . Alcohol use Yes     Comment: weekly     Allergies   Patient has no known allergies.   Review of Systems Review of Systems ROS reviewed and all are negative for acute change except as noted in the HPI.  Physical Exam Updated Vital Signs BP (!) 147/105 (BP Location: Right  Arm)   Pulse (!) 101   Temp 99.3 F (37.4 C) (Oral)   Resp (!) 26   Ht  (1.778 m)   Wt 108.9 kg   SpO2 96%   BMI 34.44 kg/m   Physical Exam  Constitutional: He is oriented to person, place, and time. Vital signs are normal. He appears well-developed and well-nourished. No distress.  HENT:  Head: Normocephalic and atraumatic.  Right Ear: Hearing, tympanic membrane, external ear and ear canal normal.  Left Ear: Hearing, tympanic membrane, external ear and ear canal normal.  Nose: Nose normal.  Mouth/Throat: Uvula is midline, oropharynx is clear and moist and mucous membranes are normal. No trismus in the jaw. No oropharyngeal exudate, posterior oropharyngeal erythema or tonsillar abscesses.  Eyes: Conjunctivae and EOM are normal. Pupils are equal, round, and  reactive to light.  Neck: Normal range of motion. Neck supple. No tracheal deviation present.  Cardiovascular: Regular rhythm, S1 normal, S2 normal, normal heart sounds, intact distal pulses and normal pulses.  Tachycardia present.   Pulmonary/Chest: Effort normal. No respiratory distress. He has decreased breath sounds in the right lower field. He has no wheezes. He has no rhonchi. He has no rales.  Abdominal: Normal appearance and bowel sounds are normal. There is no tenderness.  Musculoskeletal: Normal range of motion.  Neurological: He is alert and oriented to person, place, and time.  Skin: Skin is warm and dry.  Psychiatric: He has a normal mood and affect. His speech is normal and behavior is normal. Thought content normal.   ED Treatments / Results  Labs (all labs ordered are listed, but only abnormal results are displayed) Labs Reviewed  CBC - Abnormal; Notable for the following:       Result Value   WBC 11.4 (*)    Hemoglobin 12.9 (*)    HCT 38.6 (*)    MCV 71.5 (*)    MCH 23.9 (*)    All other components within normal limits  COMPREHENSIVE METABOLIC PANEL - Abnormal; Notable for the following:    Sodium 132 (*)    Chloride 99 (*)    Glucose, Bld 118 (*)    Albumin 3.3 (*)    All other components within normal limits  TROPONIN I  LIPASE, BLOOD  I-STAT CG4 LACTIC ACID, ED    EKG  EKG Interpretation  Date/Time:  Tuesday October 19 2016 13:07:23 EDT Ventricular Rate:  93 PR Interval:    QRS Duration: 80 QT Interval:  340 QTC Calculation: 423 R Axis:   4 Text Interpretation:  Sinus rhythm Probable left atrial enlargement T wave inversion Otherwise no significant change Confirmed by Bebe Shaggy  MD, DONALD ([id number]) on 10/19/2016 1:11:01 PM       Radiology Dg Chest 2 View  Result Date: 10/19/2016 CLINICAL DATA:  Cough, congestion, shortness of breath EXAM: CHEST  2 VIEW COMPARISON:  01/25/2016 FINDINGS: New small to moderate right pleural effusion with right lower lung  collapse/consolidation. Appearance is compatible with right basilar pneumonia and parapneumonic effusion. Minor left base atelectasis. Upper lobes remain clear. Normal heart size and vascularity. Trachea is midline. Normal bowel gas pattern. Minor thoracic spondylosis. IMPRESSION: Right lower lung consolidation and small to moderate right pleural effusion compatible with pneumonia, as above. Recommend radiographic follow-up to document resolution. Electronically Signed   By: Judie Petit.  Shick M.D.   On: 10/19/2016 13:28    Procedures Procedures (including critical care time)  Medications Ordered in ED Medications  azithromycin (ZITHROMAX) 500 MG injection (not  administered)  sodium chloride 0.9 % bolus 1,000 mL (0 mLs Intravenous Stopped 10/19/16 1500)  morphine 4 MG/ML injection 4 mg (4 mg Intravenous Given 10/19/16 1343)  cefTRIAXone (ROCEPHIN) 1 g in dextrose 5 % 50 mL IVPB (0 g Intravenous Stopped 10/19/16 1412)  azithromycin (ZITHROMAX) 500 mg in dextrose 5 % 250 mL IVPB (500 mg Intravenous New Bag/Given 10/19/16 1351)  albuterol (PROVENTIL) (2.5 MG/3ML) 0.083% nebulizer solution 5 mg (5 mg Nebulization Given 10/19/16 1510)  sodium chloride 0.9 % bolus 1,000 mL (1,000 mLs Intravenous New Bag/Given 10/19/16 1459)     Initial Impression / Assessment and Plan / ED Course  I have reviewed the triage vital signs and the nursing notes.  Pertinent labs & imaging results that were available during my care of the patient were reviewed by me and considered in my medical decision making (see chart for details).  Final Clinical Impressions(s) / ED Diagnoses  {I have reviewed and evaluated the relevant laboratory values. {I have reviewed and evaluated the relevant imaging studies. {I have interpreted the relevant EKG. {I have reviewed the relevant previous healthcare records.  {I obtained HPI from historian. {Patient discussed with supervising physician.  ED Course:  Assessment: Pt is a 57 y.o. male presents  with CP PTA s/p coughing. Seen by PCP and diagnosed viral URI. Noted worsening symptoms. On arrival, VSS, no tracheal deviation, no JVD or new murmur, RRR, breath sounds equal bilaterally, EKG without acute abnormalities, negative troponin, and CXR shows RLL Pneumonia with pleural effusion. Given Rocephin/Azithro. CBC with mild leukocytosis. Lactic acid 1.06. Ambulated patient and he appeared dyspneic with exertion. Increased Pleuritic pain. Anticipate worsening symptoms if [facility] home. Discussed with attending who has seen patient and agrees with admission.  Disposition/Plan:  Admit Pt acknowledges and agrees with plan  Supervising Physician Zadie Rhine, MD  Final diagnoses:  Community acquired pneumonia of right lower lobe of lung [facility])    New Prescriptions New Prescriptions   No medications on file     Audry Pili, PA-C 10/19/16 1524    Zadie Rhine, MD 10/19/16 1536

## 2016-10-19 NOTE — ED Triage Notes (Signed)
c/o cough, sinus congestion x1 week-started feeling pain to right chest and SOB-states pain much worse with movement and cough-NAD-steady gait

## 2016-10-19 NOTE — ED Notes (Signed)
ED Provider at bedside. 

## 2016-10-19 NOTE — ED Notes (Signed)
Patient transported to X-ray 

## 2016-10-19 NOTE — ED Notes (Signed)
RN Sue Lush will hang medication then let me know when to ambulate him.

## 2016-10-20 ENCOUNTER — Inpatient Hospital Stay (HOSPITAL_COMMUNITY): Payer: Self-pay

## 2016-10-20 ENCOUNTER — Encounter (HOSPITAL_COMMUNITY): Payer: Self-pay

## 2016-10-20 LAB — BODY FLUID CELL COUNT WITH DIFFERENTIAL
Eos, Fluid: 5 %
Lymphs, Fluid: 6 %
Monocyte-Macrophage-Serous Fluid: 28 % — ABNORMAL LOW (ref 50–90)
Neutrophil Count, Fluid: 61 % — ABNORMAL HIGH (ref 0–25)
Total Nucleated Cell Count, Fluid: 15666 uL — ABNORMAL HIGH (ref 0–1000)

## 2016-10-20 LAB — COMPREHENSIVE METABOLIC PANEL
ALBUMIN: 3 g/dL — AB (ref 3.5–5.0)
ALK PHOS: 54 U/L (ref 38–126)
ALT: 14 U/L — AB (ref 17–63)
AST: 13 U/L — ABNORMAL LOW (ref 15–41)
Anion gap: 11 (ref 5–15)
BUN: 6 mg/dL (ref 6–20)
CALCIUM: 8.8 mg/dL — AB (ref 8.9–10.3)
CHLORIDE: 102 mmol/L (ref 101–111)
CO2: 21 mmol/L — AB (ref 22–32)
Creatinine, Ser: 0.69 mg/dL (ref 0.61–1.24)
GFR calc Af Amer: >60 mL/min (ref >60)
GFR calc non Af Amer: >60 mL/min (ref >60)
GLUCOSE: 124 mg/dL — AB (ref 65–99)
Potassium: 3.8 mmol/L (ref 3.5–5.1)
SODIUM: 134 mmol/L — AB (ref 135–145)
Total Bilirubin: 0.6 mg/dL (ref 0.3–1.2)
Total Protein: 7.5 g/dL (ref 6.5–8.1)

## 2016-10-20 LAB — CBC WITH DIFFERENTIAL/PLATELET
BASOS PCT: 0 %
Basophils Absolute: 0 10*3/uL (ref 0.0–0.1)
EOS ABS: 0 10*3/uL (ref 0.0–0.7)
Eosinophils Relative: 0 %
HCT: 36.9 % — ABNORMAL LOW (ref 39.0–52.0)
HEMOGLOBIN: 12 g/dL — AB (ref 13.0–17.0)
Lymphocytes Relative: 5 %
Lymphs Abs: 0.7 10*3/uL (ref 0.7–4.0)
MCH: 23.7 pg — ABNORMAL LOW (ref 26.0–34.0)
MCHC: 32.5 g/dL (ref 30.0–36.0)
MCV: 72.9 fL — ABNORMAL LOW (ref 78.0–100.0)
Monocytes Absolute: 0.7 10*3/uL (ref 0.1–1.0)
Monocytes Relative: 5 %
NEUTROS PCT: 90 %
Neutro Abs: 12 10*3/uL — ABNORMAL HIGH (ref 1.7–7.7)
Platelets: 342 10*3/uL (ref 150–400)
RBC: 5.06 MIL/uL (ref 4.22–5.81)
RDW: 15.1 % (ref 11.5–15.5)
WBC: 13.3 10*3/uL — ABNORMAL HIGH (ref 4.0–10.5)

## 2016-10-20 LAB — URINALYSIS, ROUTINE W REFLEX MICROSCOPIC
Bilirubin Urine: NEGATIVE
Glucose, UA: NEGATIVE mg/dL
Hgb urine dipstick: NEGATIVE
Ketones, ur: 5 mg/dL — AB
Leukocytes, UA: NEGATIVE
NITRITE: NEGATIVE
Protein, ur: NEGATIVE mg/dL
SPECIFIC GRAVITY, URINE: 1.011 (ref 1.005–1.030)
pH: 6 (ref 5.0–8.0)

## 2016-10-20 LAB — RESPIRATORY PANEL BY PCR
ADENOVIRUS-RVPPCR: NOT DETECTED
Bordetella pertussis: NOT DETECTED
CORONAVIRUS NL63-RVPPCR: NOT DETECTED
CORONAVIRUS OC43-RVPPCR: NOT DETECTED
Chlamydophila pneumoniae: NOT DETECTED
Coronavirus 229E: NOT DETECTED
Coronavirus HKU1: NOT DETECTED
INFLUENZA A-RVPPCR: NOT DETECTED
Influenza B: NOT DETECTED
Metapneumovirus: NOT DETECTED
Mycoplasma pneumoniae: NOT DETECTED
PARAINFLUENZA VIRUS 1-RVPPCR: NOT DETECTED
PARAINFLUENZA VIRUS 2-RVPPCR: NOT DETECTED
PARAINFLUENZA VIRUS 4-RVPPCR: NOT DETECTED
Parainfluenza Virus 3: NOT DETECTED
RHINOVIRUS / ENTEROVIRUS - RVPPCR: NOT DETECTED
Respiratory Syncytial Virus: NOT DETECTED

## 2016-10-20 LAB — GRAM STAIN

## 2016-10-20 LAB — ALBUMIN, PLEURAL OR PERITONEAL FLUID: Albumin, Fluid: 2.1 g/dL

## 2016-10-20 LAB — GLUCOSE, PLEURAL OR PERITONEAL FLUID: Glucose, Fluid: 64 mg/dL

## 2016-10-20 LAB — PHOSPHORUS: Phosphorus: 3.1 mg/dL (ref 2.5–4.6)

## 2016-10-20 LAB — EXPECTORATED SPUTUM ASSESSMENT W REFEX TO RESP CULTURE

## 2016-10-20 LAB — MAGNESIUM: Magnesium: 1.9 mg/dL (ref 1.7–2.4)

## 2016-10-20 LAB — LACTATE DEHYDROGENASE, PLEURAL OR PERITONEAL FLUID: LD, Fluid: 930 U/L — ABNORMAL HIGH (ref 3–23)

## 2016-10-20 LAB — LACTATE DEHYDROGENASE: LDH: 87 U/L — ABNORMAL LOW (ref 98–192)

## 2016-10-20 LAB — GLUCOSE, CAPILLARY
GLUCOSE-CAPILLARY: 114 mg/dL — AB (ref 65–99)
Glucose-Capillary: 119 mg/dL — ABNORMAL HIGH (ref 65–99)
Glucose-Capillary: 127 mg/dL — ABNORMAL HIGH (ref 65–99)
Glucose-Capillary: 121 mg/dL — ABNORMAL HIGH (ref 65–99)

## 2016-10-20 LAB — EXPECTORATED SPUTUM ASSESSMENT W GRAM STAIN, RFLX TO RESP C

## 2016-10-20 LAB — PROCALCITONIN: Procalcitonin: 0.79 ng/mL

## 2016-10-20 LAB — STREP PNEUMONIAE URINARY ANTIGEN: Strep Pneumo Urinary Antigen: NEGATIVE

## 2016-10-20 MED ORDER — IOPAMIDOL (ISOVUE-300) INJECTION 61%
INTRAVENOUS | Status: AC
  Filled 2016-10-20: qty 75

## 2016-10-20 MED ORDER — IOPAMIDOL (ISOVUE-300) INJECTION 61%
75.0000 mL | Freq: Once | INTRAVENOUS | Status: AC | PRN
  Administered 2016-10-20: 75 mL via INTRAVENOUS

## 2016-10-20 NOTE — Progress Notes (Signed)
Night shift RN on coming, received report from dayshift RN that patient would be transferring to 4th floor, night shift RN obtained report sheet from dayshift nurse to give report to receiving nurse on 4th floor. Patient transferred to room 1440 via transporter and RN on 2L of oxygen,  bedside report given to RN.

## 2016-10-20 NOTE — Procedures (Signed)
Ultrasound-guided diagnostic and therapeutic right thoracentesis performed yielding 0.5liters of slightly cloudy yellow colored fluid.  There also appeared to be some loculation to this effusion as well. No immediate complications. Follow-up chest x-ray pending.       Gerald Wheeler E 10:37 AM 10/20/2016

## 2016-10-20 NOTE — Progress Notes (Signed)
PROGRESS NOTE  Gerald Wheeler:[id number] DOB: 12-07-1960 DOA: 10/19/2016 PCP: [name], FNP  HPI/Recap of past 26 hours: 56 year old male past history of hypertension and diabetes seen in the emergency room on 4/17 for progressively worsening cough vitamin going now on for several weeks. Patient started experiencing dyspnea on exertion which continued to worsen with no relief despite cough suppressant. In the emergency room, found to have a community acquired pneumonia plus large right-sided pleural effusion.  This morning, patient white blood cell count increased from 11-13. No fever. Taken for thoracentesis and 0.5 L of fluid removed noting almost 16,000 white blood cells with 61% neutrophils. Gram stain pending. Serum LDH at 930.  Pt himself feels much better after fluid removed.  Breathing much easier.  Assessment/Plan: Principal Problem:   Community-acquired Right lower lobe pneumonia (HCC) with secondary right sided pleural effusion: Status post diagnostic thoracentesis. Discussed with pulmonary. While Gram stain negative, significant white count and elevated LDH certainly concerning for exudative effusion/potential empyema. We'll go ahead and get CT of chest to better evaluate. Noted mild increase in white blood cell count from yesterday. No signs of sepsis. Active Problems:   Essential hypertension: Blood pressure stable, continue antihypertensives    Tobacco abuse: Nicotine patch   Diabetes mellitus type 2, noninsulin dependent (HCC): Sliding scale. CBGs stable   Leukocytosis   Hyponatremia: Secondary dehydration. Improved with IV fluids   Microcytic anemia: Stable   Acute respiratory failure with hypoxia (HCC): Secondary to pneumonia. Oxygen support    Code Status: Full code   Family Communication: Multiple family members at the bedside   Disposition Plan: Depending on results of CT scan, potential discharge in next few days versus longer     Consultants:  Discussed with pulmonary   Interventional radiology  Procedures:  Status post thoracentesis done 4/18   Antimicrobials:  IV Rocephin and Zithromax 4/16-present   DVT prophylaxis:  Lovenox   Objective: Vitals:   10/20/16 1035 10/20/16 1135 10/20/16 1247 10/20/16 1602  BP: 102/73  91/75   Pulse:   92   Resp:   20   Temp:   98 F (36.7 C)   TempSrc:   Oral   SpO2:  96% 95% 97%  Weight:      Height:        Intake/Output Summary (Last 24 hours) at 10/20/16 1620 Last data filed at 10/20/16 1308  Gross per 24 hour  Intake             1600 ml  Output              975 ml  Net              625 ml   Filed Weights   10/19/16 1246 10/19/16 1745  Weight: 108.9 kg (240 lb) 108.9 kg (240 lb)    Exam:   General:  Alert & oriented x 3, NAD   Cardiovascular: Regular rate and rhythm, S1-S2   Respiratory: Decreased breath sounds right mid lung down   Abdomen: Soft, nontender, non-senna, positive bowel sounds   Musculoskeletal: No clubbing or cyanosis or edema   Skin: No skin breaks, tears or lesions  Psychiatry: Patient is appropriate, no evidence of psychoses    Data Reviewed: CBC:  Recent Labs Lab 10/19/16 1258 10/20/16 0623  WBC 11.4* 13.3*  NEUTROABS  --  12.0*  HGB 12.9* 12.0*  HCT 38.6* 36.9*  MCV 71.5* 72.9*  PLT 346 342   Basic Metabolic Panel:  Recent Labs Lab 10/19/16 1258 10/20/16 0623  NA 132* 134*  K 4.5 3.8  CL 99* 102  CO2 23 21*  GLUCOSE 118* 124*  BUN 7 6  CREATININE 0.79 0.69  CALCIUM 9.2 8.8*  MG  --  1.9  PHOS  --  3.1   GFR: Estimated Creatinine Clearance: 129 mL/min (by C-G formula based on SCr of 0.69 mg/dL). Liver Function Tests:  Recent Labs Lab 10/19/16 1258 10/20/16 0623  AST 24 13*  ALT 20 14*  ALKPHOS 59 54  BILITOT 0.4 0.6  PROT 7.6 7.5  ALBUMIN 3.3* 3.0*    Recent Labs Lab 10/19/16 1258  LIPASE 13   No results for input(s): AMMONIA in the last 168 hours. Coagulation  Profile: No results for input(s): INR, PROTIME in the last 168 hours. Cardiac Enzymes:  Recent Labs Lab 10/19/16 1258  TROPONINI <0.03   BNP (last 3 results) No results for input(s): PROBNP in the last 8760 hours. HbA1C: No results for input(s): HGBA1C in the last 72 hours. CBG:  Recent Labs Lab 10/19/16 2126 10/20/16 0811 10/20/16 1347  GLUCAP 123* 119* 127*   Lipid Profile: No results for input(s): CHOL, HDL, LDLCALC, TRIG, CHOLHDL, LDLDIRECT in the last 72 hours. Thyroid Function Tests: No results for input(s): TSH, T4TOTAL, FREET4, T3FREE, THYROIDAB in the last 72 hours. Anemia Panel: No results for input(s): VITAMINB12, FOLATE, FERRITIN, TIBC, IRON, RETICCTPCT in the last 72 hours. Urine analysis:    Component Value Date/Time   COLORURINE YELLOW 10/20/2016 0035   APPEARANCEUR CLEAR 10/20/2016 0035   LABSPEC 1.011 10/20/2016 0035   PHURINE 6.0 10/20/2016 0035   GLUCOSEU NEGATIVE 10/20/2016 0035   HGBUR NEGATIVE 10/20/2016 0035   BILIRUBINUR NEGATIVE 10/20/2016 0035   KETONESUR 5 (A) 10/20/2016 0035   PROTEINUR NEGATIVE 10/20/2016 0035   NITRITE NEGATIVE 10/20/2016 0035   LEUKOCYTESUR NEGATIVE 10/20/2016 0035   Sepsis Labs: (procalcitonin:4,lacticidven:4)  ) Recent Results (from the past 240 hour(s))  Culture, blood (routine x 2) Call MD if unable to obtain prior to antibiotics being given     Status: None (Preliminary result)   Collection Time: 10/19/16  7:42 PM  Result Value Ref Range Status   Specimen Description BLOOD BLOOD LEFT ARM  Final   Special Requests   Final    BOTTLES DRAWN AEROBIC ONLY Blood Culture adequate volume   Culture   Final    NO GROWTH < 24 HOURS Performed at [facility], [address], [place], [place] [address]    Report Status PENDING  Incomplete  Culture, blood (routine x 2) Call MD if unable to obtain prior to antibiotics being given     Status: None (Preliminary result)   Collection Time: 10/19/16  7:45 PM   Result Value Ref Range Status   Specimen Description BLOOD BLOOD LEFT ARM  Final   Special Requests   Final    BOTTLES DRAWN AEROBIC ONLY Blood Culture adequate volume   Culture   Final    NO GROWTH < 24 HOURS Performed at [facility], [address], [place], [place] [address]    Report Status PENDING  Incomplete  Respiratory Panel by PCR     Status: None   Collection Time: 10/20/16  7:34 AM  Result Value Ref Range Status   Adenovirus NOT DETECTED NOT DETECTED Final   Coronavirus 229E NOT DETECTED NOT DETECTED Final   Coronavirus HKU1 NOT DETECTED NOT DETECTED Final   Coronavirus NL63 NOT DETECTED NOT DETECTED  Final   Coronavirus OC43 NOT DETECTED NOT DETECTED Final   Metapneumovirus NOT DETECTED NOT DETECTED Final   Rhinovirus / Enterovirus NOT DETECTED NOT DETECTED Final   Influenza A NOT DETECTED NOT DETECTED Final   Influenza B NOT DETECTED NOT DETECTED Final   Parainfluenza Virus 1 NOT DETECTED NOT DETECTED Final   Parainfluenza Virus 2 NOT DETECTED NOT DETECTED Final   Parainfluenza Virus 3 NOT DETECTED NOT DETECTED Final   Parainfluenza Virus 4 NOT DETECTED NOT DETECTED Final   Respiratory Syncytial Virus NOT DETECTED NOT DETECTED Final   Bordetella pertussis NOT DETECTED NOT DETECTED Final   Chlamydophila pneumoniae NOT DETECTED NOT DETECTED Final   Mycoplasma pneumoniae NOT DETECTED NOT DETECTED Final    Comment: Performed at [facility], [address], [place], [place] [address]  Gram stain     Status: None   Collection Time: 10/20/16  1:01 PM  Result Value Ref Range Status   Specimen Description FLUID RIGHT PLEURAL  Final   Special Requests NONE  Final   Gram Stain   Final    ABUNDANT WBC PRESENT,BOTH PMN AND MONONUCLEAR NO ORGANISMS SEEN Performed at [facility], [address] [place], [place] [address]    Report Status 10/20/2016 FINAL  Final      Studies: Dg Chest 2 View  Result Date: 10/20/2016 CLINICAL DATA:  Post right  thoracentesis EXAM: CHEST  2 VIEW COMPARISON:  10/19/2016 FINDINGS: Moderate right pleural effusion post thoracentesis is unchanged from yesterday. No pneumothorax. Right lower lobe atelectasis/ infiltrate unchanged. Mild left lower lobe atelectasis.  Negative for heart failure IMPRESSION: Negative for pneumothorax post right thoracentesis. No change in moderate right pleural effusion and right lower lobe airspace disease. Electronically Signed   By: Marlan Palau M.D.   On: 10/20/2016 10:52   US Thoracentesis Asp Pleural Space W/img Guide  Result Date: 10/20/2016 INDICATION: Respiratory failure secondary to community acquired right lower lobe pneumonia with pleural effusion. Request is made for diagnostic and therapeutic thoracentesis. EXAM: ULTRASOUND GUIDED DIAGNOSTIC AND THERAPEUTIC THORACENTESIS MEDICATIONS: 1% lidocaine COMPLICATIONS: None immediate. PROCEDURE: An ultrasound guided thoracentesis was thoroughly discussed with the patient and questions answered. The benefits, risks, alternatives and complications were also discussed. The patient understands and wishes to proceed with the procedure. Written consent was obtained. Ultrasound was performed to localize and mark an adequate pocket of fluid in the right chest. The area was then prepped and draped in the normal sterile fashion. 1% Lidocaine was used for local anesthesia. Under ultrasound guidance a Safe-T-Centesis catheter was introduced. Thoracentesis was performed. The catheter was removed and a dressing applied. FINDINGS: A total of approximately 0.5 L of slightly cloudy yellow fluid was removed. There did appear to be a small loculation noted within this very small amount of fluid. Samples were sent to the laboratory as requested by the clinical team. IMPRESSION: Successful ultrasound guided right thoracentesis yielding 0.5 L of pleural fluid. Read by: Barnetta Chapel, PA-C Electronically Signed   By: Gilmer Mor D.O.   On: 10/20/2016 13:52     Scheduled Meds: . amLODipine  5 mg Oral Daily  . enoxaparin (LOVENOX) injection  40 mg Subcutaneous Q24H  . gabapentin  400 mg Oral TID  . insulin aspart  0-9 Units Subcutaneous TID WC  . ipratropium-albuterol  3 mL Nebulization QID  . lisinopril  40 mg Oral Daily  . loratadine  10 mg Oral Daily  . methylPREDNISolone (SOLU-MEDROL) injection  40 mg Intravenous Q12H  .  metoprolol tartrate  25 mg Oral Daily  . nicotine  7 mg Transdermal Daily  . pravastatin  20 mg Oral q1800  . sodium chloride HYPERTONIC  4 mL Nebulization Daily    Continuous Infusions: . sodium chloride 100 mL/hr at 10/20/16 0702  . azithromycin    . cefTRIAXone (ROCEPHIN)  IV 1 g (10/20/16 1610)     LOS: 1 day     Hollice Espy, MD [facility] Pager [phone]  If 7PM-7AM, please contact night-coverage [email or web address] Password [email or web address] 10/20/2016, 4:20 PM

## 2016-10-21 ENCOUNTER — Encounter (HOSPITAL_COMMUNITY): Payer: Self-pay | Admitting: Anesthesiology

## 2016-10-21 DIAGNOSIS — J869 Pyothorax without fistula: Secondary | ICD-10-CM

## 2016-10-21 DIAGNOSIS — J90 Pleural effusion, not elsewhere classified: Secondary | ICD-10-CM

## 2016-10-21 LAB — GLUCOSE, CAPILLARY
GLUCOSE-CAPILLARY: 185 mg/dL — AB (ref 65–99)
Glucose-Capillary: 117 mg/dL — ABNORMAL HIGH (ref 65–99)
Glucose-Capillary: 146 mg/dL — ABNORMAL HIGH (ref 65–99)

## 2016-10-21 LAB — HIV ANTIBODY (ROUTINE TESTING W REFLEX): HIV SCREEN 4TH GENERATION: NONREACTIVE

## 2016-10-21 NOTE — Progress Notes (Addendum)
PROGRESS NOTE  Gerald Wheeler ZOX:[id number] DOB: 1960/07/23 DOA: 10/19/2016 PCP: [name], FNP  HPI/Recap of past 101 hours: 56 year old male past history of hypertension and diabetes seen in the emergency room on 4/17 for progressively worsening cough vitamin going now on for several weeks. Patient started experiencing dyspnea on exertion which continued to worsen with no relief despite cough suppressant. In the emergency room, found to have a community acquired pneumonia plus large right-sided pleural effusion.  This morning, patient white blood cell count increased from 11-13. No fever. Taken for thoracentesis and 0.5 L of fluid removed noting almost 16,000 white blood cells with 61% neutrophils. Gram stain-no organisms seen. Serum LDH at 930.  With potential empyema and certainly exudative effusion, patient sent for CT scan of chest which noted some mild areas of loculation. Discussed with cardiothoracic surgery who after reviewing CT are recommending VATS procedure.  Pt himself feels better. When he takes a deep inspiration, he can still felt some tightness on the right side with some pain.  Assessment/Plan: Principal Problem:   Community-acquired Right lower lobe pneumonia (HCC) with secondary right sided pleural effusion: Status post diagnostic thoracentesis. Discussed with pulmonary. While Gram stain negative, significant white count and elevated LDH certainly concerning for exudative effusion/potential empyema. CT scan notes areas of loculation, although not a lot of fluid. After reviewing CT, cardiothoracic surgery plans to take patient for VATS tomorrow. Active Problems:   Essential hypertension: Blood pressure stable, continue antihypertensives    Tobacco abuse: Nicotine patch   Diabetes mellitus type 2, noninsulin dependent (HCC): Sliding scale. CBGs stable   Leukocytosis   Hyponatremia: Secondary dehydration. Improved with IV fluids   Microcytic anemia: Stable   Acute  respiratory failure with hypoxia (HCC): Secondary to pneumonia. Oxygen support    Code Status: Full code   Family Communication: Multiple family members at the bedside   Disposition Plan: Transfer to [facility] for surgery. Anticipate will be here until beginning of next week.   Consultants:  Discussed with pulmonary   Interventional radiology  Cardiothoracic surgery  Procedures:  Status post thoracentesis done 4/18   Antimicrobials:  IV Rocephin and Zithromax 4/16-present   DVT prophylaxis:  Lovenox   Objective: Vitals:   10/21/16 0750 10/21/16 0817 10/21/16 1145 10/21/16 1343  BP:    (!) 148/78  Pulse:    83  Resp:    18  Temp:    97.5 F (36.4 C)  TempSrc:    Oral  SpO2: 98% 93% 97% 99%  Weight:      Height:        Intake/Output Summary (Last 24 hours) at 10/21/16 1420 Last data filed at 10/21/16 8119  Gross per 24 hour  Intake             1800 ml  Output             1775 ml  Net               25 ml   Filed Weights   10/19/16 1246 10/19/16 1745  Weight: 108.9 kg (240 lb) 108.9 kg (240 lb)    Exam:   General:  Alert & oriented x 3, NAD   Cardiovascular: Regular rate and rhythm, S1-S2   Respiratory: Decreased breath sounds right mid lung down   Abdomen: Soft, nontender, non-senna, positive bowel sounds   Musculoskeletal: No clubbing or cyanosis or edema   Skin: No skin breaks, tears or lesions  Psychiatry: Patient is appropriate,  no evidence of psychoses    Data Reviewed: CBC:  Recent Labs Lab 10/19/16 1258 10/20/16 0623  WBC 11.4* 13.3*  NEUTROABS  --  12.0*  HGB 12.9* 12.0*  HCT 38.6* 36.9*  MCV 71.5* 72.9*  PLT 346 342   Basic Metabolic Panel:  Recent Labs Lab 10/19/16 1258 10/20/16 0623  NA 132* 134*  K 4.5 3.8  CL 99* 102  CO2 23 21*  GLUCOSE 118* 124*  BUN 7 6  CREATININE 0.79 0.69  CALCIUM 9.2 8.8*  MG  --  1.9  PHOS  --  3.1   GFR: Estimated Creatinine Clearance: 129 mL/min (by C-G formula based on SCr  of 0.69 mg/dL). Liver Function Tests:  Recent Labs Lab 10/19/16 1258 10/20/16 0623  AST 24 13*  ALT 20 14*  ALKPHOS 59 54  BILITOT 0.4 0.6  PROT 7.6 7.5  ALBUMIN 3.3* 3.0*    Recent Labs Lab 10/19/16 1258  LIPASE 13   No results for input(s): AMMONIA in the last 168 hours. Coagulation Profile: No results for input(s): INR, PROTIME in the last 168 hours. Cardiac Enzymes:  Recent Labs Lab 10/19/16 1258  TROPONINI <0.03   BNP (last 3 results) No results for input(s): PROBNP in the last 8760 hours. HbA1C: No results for input(s): HGBA1C in the last 72 hours. CBG:  Recent Labs Lab 10/20/16 1347 10/20/16 1719 10/20/16 2245 10/21/16 0746 10/21/16 1153  GLUCAP 127* 121* 114* 117* 146*   Lipid Profile: No results for input(s): CHOL, HDL, LDLCALC, TRIG, CHOLHDL, LDLDIRECT in the last 72 hours. Thyroid Function Tests: No results for input(s): TSH, T4TOTAL, FREET4, T3FREE, THYROIDAB in the last 72 hours. Anemia Panel: No results for input(s): VITAMINB12, FOLATE, FERRITIN, TIBC, IRON, RETICCTPCT in the last 72 hours. Urine analysis:    Component Value Date/Time   COLORURINE YELLOW 10/20/2016 0035   APPEARANCEUR CLEAR 10/20/2016 0035   LABSPEC 1.011 10/20/2016 0035   PHURINE 6.0 10/20/2016 0035   GLUCOSEU NEGATIVE 10/20/2016 0035   HGBUR NEGATIVE 10/20/2016 0035   BILIRUBINUR NEGATIVE 10/20/2016 0035   KETONESUR 5 (A) 10/20/2016 0035   PROTEINUR NEGATIVE 10/20/2016 0035   NITRITE NEGATIVE 10/20/2016 0035   LEUKOCYTESUR NEGATIVE 10/20/2016 0035   Sepsis Labs: (procalcitonin:4,lacticidven:4)  ) Recent Results (from the past 240 hour(s))  Culture, blood (routine x 2) Call MD if unable to obtain prior to antibiotics being given     Status: None (Preliminary result)   Collection Time: 10/19/16  7:42 PM  Result Value Ref Range Status   Specimen Description BLOOD BLOOD LEFT ARM  Final   Special Requests   Final    BOTTLES DRAWN AEROBIC ONLY Blood  Culture adequate volume   Culture   Final    NO GROWTH 2 DAYS Performed at [facility], [address] [place], [place] [address]    Report Status PENDING  Incomplete  Culture, blood (routine x 2) Call MD if unable to obtain prior to antibiotics being given     Status: None (Preliminary result)   Collection Time: 10/19/16  7:45 PM  Result Value Ref Range Status   Specimen Description BLOOD BLOOD LEFT ARM  Final   Special Requests   Final    BOTTLES DRAWN AEROBIC ONLY Blood Culture adequate volume   Culture   Final    NO GROWTH 2 DAYS Performed at [facility], [address] [place], [place] [address]    Report Status PENDING  Incomplete  Respiratory Panel by PCR  Status: None   Collection Time: 10/20/16  7:34 AM  Result Value Ref Range Status   Adenovirus NOT DETECTED NOT DETECTED Final   Coronavirus 229E NOT DETECTED NOT DETECTED Final   Coronavirus HKU1 NOT DETECTED NOT DETECTED Final   Coronavirus NL63 NOT DETECTED NOT DETECTED Final   Coronavirus OC43 NOT DETECTED NOT DETECTED Final   Metapneumovirus NOT DETECTED NOT DETECTED Final   Rhinovirus / Enterovirus NOT DETECTED NOT DETECTED Final   Influenza A NOT DETECTED NOT DETECTED Final   Influenza B NOT DETECTED NOT DETECTED Final   Parainfluenza Virus 1 NOT DETECTED NOT DETECTED Final   Parainfluenza Virus 2 NOT DETECTED NOT DETECTED Final   Parainfluenza Virus 3 NOT DETECTED NOT DETECTED Final   Parainfluenza Virus 4 NOT DETECTED NOT DETECTED Final   Respiratory Syncytial Virus NOT DETECTED NOT DETECTED Final   Bordetella pertussis NOT DETECTED NOT DETECTED Final   Chlamydophila pneumoniae NOT DETECTED NOT DETECTED Final   Mycoplasma pneumoniae NOT DETECTED NOT DETECTED Final    Comment: Performed at [facility], [address], [place], [place] [address]  Culture, body fluid-bottle     Status: None (Preliminary result)   Collection Time: 10/20/16  1:01 PM  Result Value Ref Range Status    Specimen Description FLUID RIGHT PLEURAL  Final   Special Requests NONE  Final   Culture   Final    NO GROWTH < 24 HOURS Performed at [facility], [address] [place], [place] [address]    Report Status PENDING  Incomplete  Gram stain     Status: None   Collection Time: 10/20/16  1:01 PM  Result Value Ref Range Status   Specimen Description FLUID RIGHT PLEURAL  Final   Special Requests NONE  Final   Gram Stain   Final    ABUNDANT WBC PRESENT,BOTH PMN AND MONONUCLEAR NO ORGANISMS SEEN Performed at [facility], [address] [place], [place] [address]    Report Status 10/20/2016 FINAL  Final  Culture, sputum-assessment     Status: None   Collection Time: 10/20/16  4:09 PM  Result Value Ref Range Status   Specimen Description SPUTUM  Final   Special Requests NONE  Final   Sputum evaluation THIS SPECIMEN IS ACCEPTABLE FOR SPUTUM CULTURE  Final   Report Status 10/20/2016 FINAL  Final  Culture, respiratory (NON-Expectorated)     Status: None (Preliminary result)   Collection Time: 10/20/16  4:09 PM  Result Value Ref Range Status   Specimen Description SPUTUM  Final   Special Requests NONE Reflexed from [id number]  Final   Gram Stain   Final    RARE WBC PRESENT, PREDOMINANTLY PMN RARE GRAM POSITIVE COCCI IN PAIRS RARE GRAM POSITIVE RODS    Culture   Final    TOO YOUNG TO READ Performed at [facility], [address], [place], [place] [address]    Report Status PENDING  Incomplete      Studies: Ct Chest W Contrast  Result Date: 10/20/2016 CLINICAL DATA:  Pneumonia.  Effusion.  Thoracentesis. EXAM: CT CHEST WITH CONTRAST TECHNIQUE: Multidetector CT imaging of the chest was performed during intravenous contrast administration. CONTRAST:  75mL ISOVUE-300 IOPAMIDOL (ISOVUE-300) INJECTION 61% COMPARISON:  Radiography same day.  Radiography 10/19/2016 FINDINGS: Cardiovascular: Aortic atherosclerosis. No aneurysm or dissection. Extensive coronary artery  calcification. Small amount of pericardial fluid. Pulmonary arteries appear unremarkable. Mediastinum/Nodes: No mediastinal mass or lymphadenopathy. Lungs/Pleura: There is mild atelectasis at the  left lung base. The left lung is otherwise clear. On the right, there is pleural fluid which largely layers dependently but shows some loculation. There is complete collapse of the right lower lobe and near complete collapse of the right middle lobe. Right upper lobe remains largely inflated. Both lungs show minimal areas of emphysema at the apices. Upper Abdomen: Negative Musculoskeletal: Normal IMPRESSION: Right pleural effusion, largely dependent but with some areas of loculation. Complete collapse of the right lower lobe and right middle lobe. Right upper lobe almost completely inflated. Dependent atelectasis at the left lung base. No pleural effusion seen on the left. Aortic atherosclerosis. Extensive coronary artery calcification. Small amount of pericardial fluid. Electronically Signed   By: Paulina Fusi M.D.   On: 10/20/2016 21:31    Scheduled Meds: . amLODipine  5 mg Oral Daily  . enoxaparin (LOVENOX) injection  40 mg Subcutaneous Q24H  . gabapentin  400 mg Oral TID  . insulin aspart  0-9 Units Subcutaneous TID WC  . ipratropium-albuterol  3 mL Nebulization QID  . lisinopril  40 mg Oral Daily  . loratadine  10 mg Oral Daily  . methylPREDNISolone (SOLU-MEDROL) injection  40 mg Intravenous Q12H  . metoprolol tartrate  25 mg Oral Daily  . nicotine  7 mg Transdermal Daily  . pravastatin  20 mg Oral q1800  . sodium chloride HYPERTONIC  4 mL Nebulization Daily    Continuous Infusions: . sodium chloride 100 mL/hr at 10/21/16 0814  . azithromycin 500 mg (10/20/16 1812)  . cefTRIAXone (ROCEPHIN)  IV Stopped (10/20/16 1640)     LOS: 2 days     Hollice Espy, MD [facility] Pager [phone]  If 7PM-7AM, please contact night-coverage [email or web address] Password [email or web address] 10/21/2016, 2:20 PM

## 2016-10-21 NOTE — Progress Notes (Signed)
Report called to receiving RN, Mitzi Davenport at [facility]. All questions answered.  VSS.  Transport set up with [facility].  Pt aware.

## 2016-10-21 NOTE — Anesthesia Preprocedure Evaluation (Addendum)
Anesthesia Evaluation  Patient identified by MRN, date of birth, ID band Patient awake    Reviewed: Allergy & Precautions, Patient's Chart, lab work & pertinent test results, reviewed documented beta blocker date and time   Airway Mallampati: II       Dental  (+) Partial Upper, Teeth Intact, Dental Advisory Given   Pulmonary neg pulmonary ROS, asthma , pneumonia, Current Smoker,    breath sounds clear to auscultation       Cardiovascular hypertension, Pt. on medications and Pt. on home beta blockers  Rhythm:Regular Rate:Normal     Neuro/Psych negative neurological ROS     GI/Hepatic negative GI ROS, Neg liver ROS,   Endo/Other  negative endocrine ROSdiabetes, Type 2, Oral Hypoglycemic Agents  Renal/GU negative Renal ROS     Musculoskeletal  (+) Arthritis , Osteoarthritis,    Abdominal   Peds  Hematology negative hematology ROS (+)   Anesthesia Other Findings Day of surgery medications reviewed with the patient.  Reproductive/Obstetrics                           Lab Results  Component Value Date   WBC 13.3 (H) 10/20/2016   HGB 12.0 (L) 10/20/2016   HCT 36.9 (L) 10/20/2016   MCV 72.9 (L) 10/20/2016   PLT 342 10/20/2016   Lab Results  Component Value Date   CREATININE 0.69 10/20/2016   BUN 6 10/20/2016   NA 134 (L) 10/20/2016   K 3.8 10/20/2016   CL 102 10/20/2016   CO2 21 (L) 10/20/2016   No results found for: INR, PROTIME  EKG: normal sinus rhythm.   Anesthesia Physical Anesthesia Plan  ASA: II  Anesthesia Plan: General   Post-op Pain Management:    Induction: Intravenous  Airway Management Planned: Oral ETT and Double Lumen EBT  Additional Equipment: Arterial line, CVP and Ultrasound Guidance Line Placement  Intra-op Plan:   Post-operative Plan: Extubation in OR  Informed Consent: I have reviewed the patients History and Physical, chart, labs and discussed the  procedure including the risks, benefits and alternatives for the proposed anesthesia with the patient or authorized representative who has indicated his/her understanding and acceptance.   Dental advisory given  Plan Discussed with: CRNA, Surgeon and Anesthesiologist  Anesthesia Plan Comments:        Anesthesia Quick Evaluation

## 2016-10-21 NOTE — Consult Note (Signed)
[address]       [place] [address]             [phone]      Cardiothoracic Surgery Consultation  Reason for Consult: Loculated right pleural effusion/Empyema Referring Physician: Dr. Alla German is an 56 y.o. male.  HPI:   The patient is a 57 year old gentleman with hypertension, diabetes, hypercholesterolemia, and smoking who reports being in his usual state of health until Tuesday morning. He has a history of asthma and allergies. He said that he walked a couple miles on Monday evening and felt fine but Tuesday morning he had a bad coughing episode and then had severe right chest wall pain. He thought he broke a rib. He started developing shortness of breath after this but went to work. His breathing worsened so he went to [facility] ER. CXR showed a moderate sized right pleural effusion. He had a thoracentesis yesterday removing 500 cc of slightly cloudy yellow fluid that was exudative. Gram stain negative, culture negative.  CT of the chest afterwards showed a partially loculated moderate right pleural effusion with complete collapse of the RLL, partial collapse of the middle lobe. He feels better since the thoracentesis with improvement in his chest pain.  Past Medical History:  Diagnosis Date  . Allergy   . Arthritis   . Asthma   . Diabetes mellitus without complication ([place])   . Essential hypertension 02/27/2016  . High cholesterol   . History of stroke   . Hypertension     Past Surgical History:  Procedure Laterality Date  . BACK SURGERY    . CYSTECTOMY N/A    cyst removed about 30 years ago  . TONSILLECTOMY  1973    Family History  Problem Relation Age of Onset  . Heart disease Mother   . High blood pressure Mother   . Heart disease Father   . Diabetes Father     Social History:  reports that he has been smoking Cigarettes.  He has never used smokeless tobacco. He reports that he drinks alcohol. He reports that he does not use  drugs.  Allergies: No Known Allergies  Medications:  I have reviewed the patient's current medications. Prior to Admission:  Prescriptions Prior to Admission  Medication Sig Dispense Refill Last Dose  . amLODipine (NORVASC) 5 MG tablet Take 1 tablet (5 mg total) by mouth daily. 30 tablet 0 10/18/2016 at Unknown time  . benzonatate (TESSALON) 100 MG capsule Take 100 mg by mouth 3 (three) times daily as needed for cough.   10/18/2016 at Unknown time  . gabapentin (NEURONTIN) 400 MG capsule Take 1 capsule (400 mg total) by mouth 3 (three) times daily. 120 capsule 0 10/18/2016 at Unknown time  . lisinopril (PRINIVIL,ZESTRIL) 40 MG tablet Take 1 tablet (40 mg total) by mouth daily. 30 tablet 0 10/18/2016 at Unknown time  . loratadine (CLARITIN) 10 MG tablet Take 10 mg by mouth daily.   Past Week at Unknown time  . lovastatin (MEVACOR) 20 MG tablet Take 20 mg by mouth at bedtime.   10/18/2016 at Unknown time  . meloxicam (MOBIC) 15 MG tablet Take 15 mg by mouth daily as needed for pain.   10/18/2016 at Unknown time  . metFORMIN (GLUCOPHAGE) 500 MG tablet Take 1 tablet (500 mg total) by mouth 2 (two) times daily with a meal. 30 tablet 0 10/18/2016 at Unknown time  . metoprolol tartrate (LOPRESSOR) 25 MG  tablet Take 25 mg by mouth daily.   10/18/2016 at 1400   Scheduled: . amLODipine  5 mg Oral Daily  . enoxaparin (LOVENOX) injection  40 mg Subcutaneous Q24H  . gabapentin  400 mg Oral TID  . insulin aspart  0-9 Units Subcutaneous TID WC  . ipratropium-albuterol  3 mL Nebulization QID  . lisinopril  40 mg Oral Daily  . loratadine  10 mg Oral Daily  . methylPREDNISolone (SOLU-MEDROL) injection  40 mg Intravenous Q12H  . metoprolol tartrate  25 mg Oral Daily  . nicotine  7 mg Transdermal Daily  . pravastatin  20 mg Oral q1800  . sodium chloride HYPERTONIC  4 mL Nebulization Daily   Continuous: . sodium chloride 100 mL/hr at 10/21/16 0814  . azithromycin 500 mg (10/21/16 1831)  . cefTRIAXone  (ROCEPHIN)  IV Stopped (10/21/16 1501)   GOT:[id number] Anti-infectives    Start     Dose/Rate Route Frequency Ordered Stop   10/20/16 1400  cefTRIAXone (ROCEPHIN) 1 g in dextrose 5 % 50 mL IVPB     1 g 100 mL/hr over 30 Minutes Intravenous Every 24 hours 10/19/16 1833 10/27/16 1359   10/20/16 1400  azithromycin (ZITHROMAX) 500 mg in dextrose 5 % 250 mL IVPB     500 mg 250 mL/hr over 60 Minutes Intravenous Every 24 hours 10/19/16 1833 10/27/16 1759   10/19/16 1345  cefTRIAXone (ROCEPHIN) 1 g in dextrose 5 % 50 mL IVPB     1 g 100 mL/hr over 30 Minutes Intravenous  Once 10/19/16 1338 10/19/16 1412   10/19/16 1345  azithromycin (ZITHROMAX) 500 mg in dextrose 5 % 250 mL IVPB     500 mg 250 mL/hr over 60 Minutes Intravenous  Once 10/19/16 1338 10/19/16 1539   10/19/16 1345  azithromycin (ZITHROMAX) 500 MG injection    Comments:  Woodward Ku   : cabinet override      10/19/16 1345 10/20/16 0159      Results for orders placed or performed during the hospital encounter of 10/19/16 (from the past 48 hour(s))  HIV antibody (Routine Testing)     Status: None   Collection Time: 10/19/16  7:39 PM  Result Value Ref Range   HIV Screen 4th Generation wRfx Non Reactive Non Reactive    Comment: (NOTE) Performed At: [facility] [place], [place] [id number] Lindon Romp MD [id number]   Culture, blood (routine x 2) Call MD if unable to obtain prior to antibiotics being given     Status: None (Preliminary result)   Collection Time: 10/19/16  7:42 PM  Result Value Ref Range   Specimen Description BLOOD BLOOD LEFT ARM    Special Requests      BOTTLES DRAWN AEROBIC ONLY Blood Culture adequate volume   Culture      NO GROWTH 2 DAYS Performed at [facility], [place] [address], [place], [place] [address]    Report Status PENDING   Culture, blood (routine x 2) Call MD if unable to obtain prior to antibiotics being given     Status: None (Preliminary  result)   Collection Time: 10/19/16  7:45 PM  Result Value Ref Range   Specimen Description BLOOD BLOOD LEFT ARM    Special Requests      BOTTLES DRAWN AEROBIC ONLY Blood Culture adequate volume   Culture      NO GROWTH 2 DAYS Performed at [facility], [place] [address] [place], [place] [address]  Report Status PENDING   Glucose, capillary     Status: Abnormal   Collection Time: 10/19/16  9:26 PM  Result Value Ref Range   Glucose-Capillary 123 (H) 65 - 99 mg/dL  Strep pneumoniae urinary antigen     Status: None   Collection Time: 10/20/16 12:35 AM  Result Value Ref Range   Strep Pneumo Urinary Antigen NEGATIVE NEGATIVE    Comment:        Infection due to S. pneumoniae cannot be absolutely ruled out since the antigen present may be below the detection limit of the test.   Urinalysis, Routine w reflex microscopic     Status: Abnormal   Collection Time: 10/20/16 12:35 AM  Result Value Ref Range   Color, Urine YELLOW YELLOW   APPearance CLEAR CLEAR   Specific Gravity, Urine 1.011 1.005 - 1.030   pH 6.0 5.0 - 8.0   Glucose, UA NEGATIVE NEGATIVE mg/dL   Hgb urine dipstick NEGATIVE NEGATIVE   Bilirubin Urine NEGATIVE NEGATIVE   Ketones, ur 5 (A) NEGATIVE mg/dL   Protein, ur NEGATIVE NEGATIVE mg/dL   Nitrite NEGATIVE NEGATIVE   Leukocytes, UA NEGATIVE NEGATIVE  CBC with Differential/Platelet     Status: Abnormal   Collection Time: 10/20/16  6:23 AM  Result Value Ref Range   WBC 13.3 (H) 4.0 - 10.5 K/uL   RBC 5.06 4.22 - 5.81 MIL/uL   Hemoglobin 12.0 (L) 13.0 - 17.0 g/dL   HCT 36.9 (L) 39.0 - 52.0 %   MCV 72.9 (L) 78.0 - 100.0 fL   MCH 23.7 (L) 26.0 - 34.0 pg   MCHC 32.5 30.0 - 36.0 g/dL   RDW 15.1 11.5 - 15.5 %   Platelets 342 150 - 400 K/uL   Neutrophils Relative % 90 %   Neutro Abs 12.0 (H) 1.7 - 7.7 K/uL   Lymphocytes Relative 5 %   Lymphs Abs 0.7 0.7 - 4.0 K/uL   Monocytes Relative 5 %   Monocytes Absolute 0.7 0.1 - 1.0 K/uL   Eosinophils Relative 0 %    Eosinophils Absolute 0.0 0.0 - 0.7 K/uL   Basophils Relative 0 %   Basophils Absolute 0.0 0.0 - 0.1 K/uL  Comprehensive metabolic panel     Status: Abnormal   Collection Time: 10/20/16  6:23 AM  Result Value Ref Range   Sodium 134 (L) 135 - 145 mmol/L   Potassium 3.8 3.5 - 5.1 mmol/L   Chloride 102 101 - 111 mmol/L   CO2 21 (L) 22 - 32 mmol/L   Glucose, Bld 124 (H) 65 - 99 mg/dL   BUN 6 6 - 20 mg/dL   Creatinine, Ser 0.69 0.61 - 1.24 mg/dL   Calcium 8.8 (L) 8.9 - 10.3 mg/dL   Total Protein 7.5 6.5 - 8.1 g/dL   Albumin 3.0 (L) 3.5 - 5.0 g/dL   AST 13 (L) 15 - 41 U/L   ALT 14 (L) 17 - 63 U/L   Alkaline Phosphatase 54 38 - 126 U/L   Total Bilirubin 0.6 0.3 - 1.2 mg/dL   GFR calc non Af Amer >60 >60 mL/min   GFR calc Af Amer >60 >60 mL/min    Comment: (NOTE) The eGFR has been calculated using the CKD EPI equation. This calculation has not been validated in all clinical situations. eGFR's persistently <60 mL/min signify possible Chronic Kidney Disease.    Anion gap 11 5 - 15  Magnesium     Status: None   Collection Time: 10/20/16  6:23  AM  Result Value Ref Range   Magnesium 1.9 1.7 - 2.4 mg/dL  Phosphorus     Status: None   Collection Time: 10/20/16  6:23 AM  Result Value Ref Range   Phosphorus 3.1 2.5 - 4.6 mg/dL  Respiratory Panel by PCR     Status: None   Collection Time: 10/20/16  7:34 AM  Result Value Ref Range   Adenovirus NOT DETECTED NOT DETECTED   Coronavirus 229E NOT DETECTED NOT DETECTED   Coronavirus HKU1 NOT DETECTED NOT DETECTED   Coronavirus NL63 NOT DETECTED NOT DETECTED   Coronavirus OC43 NOT DETECTED NOT DETECTED   Metapneumovirus NOT DETECTED NOT DETECTED   Rhinovirus / Enterovirus NOT DETECTED NOT DETECTED   Influenza A NOT DETECTED NOT DETECTED   Influenza B NOT DETECTED NOT DETECTED   Parainfluenza Virus 1 NOT DETECTED NOT DETECTED   Parainfluenza Virus 2 NOT DETECTED NOT DETECTED   Parainfluenza Virus 3 NOT DETECTED NOT DETECTED   Parainfluenza  Virus 4 NOT DETECTED NOT DETECTED   Respiratory Syncytial Virus NOT DETECTED NOT DETECTED   Bordetella pertussis NOT DETECTED NOT DETECTED   Chlamydophila pneumoniae NOT DETECTED NOT DETECTED   Mycoplasma pneumoniae NOT DETECTED NOT DETECTED    Comment: Performed at [facility], [place] [address], [place], [place] [address]  Glucose, capillary     Status: Abnormal   Collection Time: 10/20/16  8:11 AM  Result Value Ref Range   Glucose-Capillary 119 (H) 65 - 99 mg/dL  Lactate dehydrogenase (pleural or peritoneal fluid)     Status: Abnormal   Collection Time: 10/20/16 10:21 AM  Result Value Ref Range   LD, Fluid 930 (H) 3 - 23 U/L    Comment: (NOTE) Results should be evaluated in conjunction with serum values Performed at [facility] [address] [place], [place] [address]    Fluid Type-FLDH Pleural R   Body fluid cell count with differential     Status: Abnormal   Collection Time: 10/20/16 10:21 AM  Result Value Ref Range   Fluid Type-FCT Pleural R    Color, Fluid YELLOW YELLOW   Appearance, Fluid CLOUDY (A) CLEAR   WBC, Fluid 15,666 (H) 0 - 1,000 cu mm   Neutrophil Count, Fluid 61 (H) 0 - 25 %   Lymphs, Fluid 6 %   Monocyte-Macrophage-Serous Fluid 28 (L) 50 - 90 %   Eos, Fluid 5 %   Other Cells, Fluid CORRELATE WITH CYTOLOGY. %  Albumin, pleural or peritoneal fluid     Status: None   Collection Time: 10/20/16 10:21 AM  Result Value Ref Range   Albumin, Fluid 2.1 g/dL    Comment: (NOTE) No normal range established for this test Results should be evaluated in conjunction with serum values Performed at [facility] [address], [place], [place] [address]    Fluid Type-FALB Pleural R   Glucose, plural or peritoneal fluid     Status: None   Collection Time: 10/20/16 10:21 AM  Result Value Ref Range   Glucose, Fluid 64 mg/dL    Comment: (NOTE) No normal range established for this test Results should be evaluated in conjunction with serum  values Performed at [facility] [address], [place], [place] [address]    Fluid Type-FGLU Pleural R   Culture, body fluid-bottle     Status: None (Preliminary result)   Collection Time: 10/20/16  1:01 PM  Result Value Ref Range   Specimen Description FLUID RIGHT  PLEURAL    Special Requests NONE    Culture      NO GROWTH < 24 HOURS Performed at [facility], [place] [address], [place], [place] [address]    Report Status PENDING   Gram stain     Status: None   Collection Time: 10/20/16  1:01 PM  Result Value Ref Range   Specimen Description FLUID RIGHT PLEURAL    Special Requests NONE    Gram Stain      ABUNDANT WBC PRESENT,BOTH PMN AND MONONUCLEAR NO ORGANISMS SEEN Performed at [facility], [place] [address], [place], [place] [address]    Report Status 10/20/2016 FINAL   Glucose, capillary     Status: Abnormal   Collection Time: 10/20/16  1:47 PM  Result Value Ref Range   Glucose-Capillary 127 (H) 65 - 99 mg/dL  Culture, sputum-assessment     Status: None   Collection Time: 10/20/16  4:09 PM  Result Value Ref Range   Specimen Description SPUTUM    Special Requests NONE    Sputum evaluation THIS SPECIMEN IS ACCEPTABLE FOR SPUTUM CULTURE    Report Status 10/20/2016 FINAL   Culture, respiratory (NON-Expectorated)     Status: None (Preliminary result)   Collection Time: 10/20/16  4:09 PM  Result Value Ref Range   Specimen Description SPUTUM    Special Requests NONE Reflexed from [id number]    Gram Stain      RARE WBC PRESENT, PREDOMINANTLY PMN RARE GRAM POSITIVE COCCI IN PAIRS RARE GRAM POSITIVE RODS    Culture      TOO YOUNG TO READ Performed at [facility], [place] [address], [place], [place] [address]    Report Status PENDING   Lactate dehydrogenase     Status: Abnormal   Collection Time: 10/20/16  4:46 PM  Result Value Ref Range   LDH 87 (L) 98 - 192 U/L  Procalcitonin - Baseline     Status: None   Collection Time: 10/20/16  4:46 PM  Result  Value Ref Range   Procalcitonin 0.79 ng/mL    Comment:        Interpretation: PCT > 0.5 ng/mL and <= 2 ng/mL: Systemic infection (sepsis) is possible, but other conditions are known to elevate PCT as well. (NOTE)         ICU PCT Algorithm               Non ICU PCT Algorithm    ----------------------------     ------------------------------         PCT < 0.25 ng/mL                 PCT < 0.1 ng/mL     Stopping of antibiotics            Stopping of antibiotics       strongly encouraged.               strongly encouraged.    ----------------------------     ------------------------------       PCT level decrease by               PCT < 0.25 ng/mL       >= 80% from peak PCT       OR PCT 0.25 - 0.5 ng/mL          Stopping of antibiotics  encouraged.     Stopping of antibiotics           encouraged.    ----------------------------     ------------------------------       PCT level decrease by              PCT >= 0.25 ng/mL       < 80% from peak PCT        AND PCT >= 0.5 ng/mL             Continuing antibiotics                                              encouraged.       Continuing antibiotics            encouraged.    ----------------------------     ------------------------------     PCT level increase compared          PCT > 0.5 ng/mL         with peak PCT AND          PCT >= 0.5 ng/mL             Escalation of antibiotics                                          strongly encouraged.      Escalation of antibiotics        strongly encouraged.   Glucose, capillary     Status: Abnormal   Collection Time: 10/20/16  5:19 PM  Result Value Ref Range   Glucose-Capillary 121 (H) 65 - 99 mg/dL  Glucose, capillary     Status: Abnormal   Collection Time: 10/20/16 10:45 PM  Result Value Ref Range   Glucose-Capillary 114 (H) 65 - 99 mg/dL  Glucose, capillary     Status: Abnormal   Collection Time: 10/21/16  7:46 AM  Result Value Ref Range    Glucose-Capillary 117 (H) 65 - 99 mg/dL  Glucose, capillary     Status: Abnormal   Collection Time: 10/21/16 11:53 AM  Result Value Ref Range   Glucose-Capillary 146 (H) 65 - 99 mg/dL  Glucose, capillary     Status: Abnormal   Collection Time: 10/21/16  4:50 PM  Result Value Ref Range   Glucose-Capillary 185 (H) 65 - 99 mg/dL    Dg Chest 2 View  Result Date: 10/20/2016 CLINICAL DATA:  Post right thoracentesis EXAM: CHEST  2 VIEW COMPARISON:  10/19/2016 FINDINGS: Moderate right pleural effusion post thoracentesis is unchanged from yesterday. No pneumothorax. Right lower lobe atelectasis/ infiltrate unchanged. Mild left lower lobe atelectasis.  Negative for heart failure IMPRESSION: Negative for pneumothorax post right thoracentesis. No change in moderate right pleural effusion and right lower lobe airspace disease. Electronically Signed   By: Franchot Gallo M.D.   On: 10/20/2016 10:52   Ct Chest W Contrast  Result Date: 10/20/2016 CLINICAL DATA:  Pneumonia.  Effusion.  Thoracentesis. EXAM: CT CHEST WITH CONTRAST TECHNIQUE: Multidetector CT imaging of the chest was performed during intravenous contrast administration. CONTRAST:  27m ISOVUE-300 IOPAMIDOL (ISOVUE-300) INJECTION 61% COMPARISON:  Radiography same day.  Radiography 10/19/2016 FINDINGS: Cardiovascular: Aortic atherosclerosis. No aneurysm or dissection. Extensive coronary artery calcification. Small amount of pericardial fluid.  Pulmonary arteries appear unremarkable. Mediastinum/Nodes: No mediastinal mass or lymphadenopathy. Lungs/Pleura: There is mild atelectasis at the left lung base. The left lung is otherwise clear. On the right, there is pleural fluid which largely layers dependently but shows some loculation. There is complete collapse of the right lower lobe and near complete collapse of the right middle lobe. Right upper lobe remains largely inflated. Both lungs show minimal areas of emphysema at the apices. Upper Abdomen: Negative  Musculoskeletal: Normal IMPRESSION: Right pleural effusion, largely dependent but with some areas of loculation. Complete collapse of the right lower lobe and right middle lobe. Right upper lobe almost completely inflated. Dependent atelectasis at the left lung base. No pleural effusion seen on the left. Aortic atherosclerosis. Extensive coronary artery calcification. Small amount of pericardial fluid. Electronically Signed   By: Paulina Fusi M.D.   On: 10/20/2016 21:31   US Thoracentesis Asp Pleural Space W/img Guide  Result Date: 10/20/2016 INDICATION: Respiratory failure secondary to community acquired right lower lobe pneumonia with pleural effusion. Request is made for diagnostic and therapeutic thoracentesis. EXAM: ULTRASOUND GUIDED DIAGNOSTIC AND THERAPEUTIC THORACENTESIS MEDICATIONS: 1% lidocaine COMPLICATIONS: None immediate. PROCEDURE: An ultrasound guided thoracentesis was thoroughly discussed with the patient and questions answered. The benefits, risks, alternatives and complications were also discussed. The patient understands and wishes to proceed with the procedure. Written consent was obtained. Ultrasound was performed to localize and mark an adequate pocket of fluid in the right chest. The area was then prepped and draped in the normal sterile fashion. 1% Lidocaine was used for local anesthesia. Under ultrasound guidance a Safe-T-Centesis catheter was introduced. Thoracentesis was performed. The catheter was removed and a dressing applied. FINDINGS: A total of approximately 0.5 L of slightly cloudy yellow fluid was removed. There did appear to be a small loculation noted within this very small amount of fluid. Samples were sent to the laboratory as requested by the clinical team. IMPRESSION: Successful ultrasound guided right thoracentesis yielding 0.5 L of pleural fluid. Read by: Barnetta Chapel, PA-C Electronically Signed   By: Gilmer Mor D.O.   On: 10/20/2016 13:52    Review of Systems    Constitutional: Negative for chills, diaphoresis, fever, malaise/fatigue and weight loss.  HENT: Negative.   Eyes: Negative.   Respiratory: Positive for cough and shortness of breath. Negative for hemoptysis and sputum production.   Cardiovascular: Positive for chest pain. Negative for orthopnea, leg swelling and PND.  Gastrointestinal: Negative.   Genitourinary: Negative.   Musculoskeletal:       Right chest wall pain  Skin: Negative.   Neurological: Negative.   Endo/Heme/Allergies: Negative.   Psychiatric/Behavioral: Negative.    Blood pressure 138/86, pulse 88, temperature 97.8 F (36.6 C), temperature source Oral, resp. rate 18, height 5\' 10"  (1.778 m), weight 108.9 kg (240 lb), SpO2 98 %. Physical Exam  Constitutional: He is oriented to person, place, and time. He appears well-developed and well-nourished. No distress.  Looks a little short of breath with talking  HENT:  Head: Normocephalic and atraumatic.  Mouth/Throat: Oropharynx is clear and moist.  Eyes: EOM are normal. Pupils are equal, round, and reactive to light.  Neck: Normal range of motion. Neck supple. No JVD present. No thyromegaly present.  Cardiovascular: Regular rhythm, normal heart sounds and intact distal pulses.   No murmur heard. Respiratory: He exhibits no tenderness.  Increased work of breathing Decreased breath sounds right lower lobe  GI: Soft. Bowel sounds are normal. He exhibits no distension. There is no tenderness.  Musculoskeletal: Normal range of motion. He exhibits no edema.  Lymphadenopathy:    He has no cervical adenopathy.  Neurological: He is alert and oriented to person, place, and time. He has normal strength. No cranial nerve deficit or sensory deficit.  Skin: Skin is warm and dry.  Psychiatric: He has a normal mood and affect.   CT CHEST W CONTRAST (Accession [id number]) (Order [id number])  Imaging  Date: 10/20/2016 Department: [facility] 2W CARDIAC UNIT Released  By/Authorizing: Annita Brod, MD (auto-released)  Exam Information   Status Exam Begun  Exam Ended   Final [99] 10/20/2016 7:01 PM 10/20/2016 7:40 PM  PACS Images   Show images for CT CHEST W CONTRAST  Study Result   CLINICAL DATA:  Pneumonia.  Effusion.  Thoracentesis.  EXAM: CT CHEST WITH CONTRAST  TECHNIQUE: Multidetector CT imaging of the chest was performed during intravenous contrast administration.  CONTRAST:  50m ISOVUE-300 IOPAMIDOL (ISOVUE-300) INJECTION 61%  COMPARISON:  Radiography same day.  Radiography 10/19/2016  FINDINGS: Cardiovascular: Aortic atherosclerosis. No aneurysm or dissection. Extensive coronary artery calcification. Small amount of pericardial fluid. Pulmonary arteries appear unremarkable.  Mediastinum/Nodes: No mediastinal mass or lymphadenopathy.  Lungs/Pleura: There is mild atelectasis at the left lung base. The left lung is otherwise clear. On the right, there is pleural fluid which largely layers dependently but shows some loculation. There is complete collapse of the right lower lobe and near complete collapse of the right middle lobe. Right upper lobe remains largely inflated.  Both lungs show minimal areas of emphysema at the apices.  Upper Abdomen: Negative  Musculoskeletal: Normal  IMPRESSION: Right pleural effusion, largely dependent but with some areas of loculation.  Complete collapse of the right lower lobe and right middle lobe. Right upper lobe almost completely inflated.  Dependent atelectasis at the left lung base. No pleural effusion seen on the left.  Aortic atherosclerosis. Extensive coronary artery calcification. Small amount of pericardial fluid.   Electronically Signed   By: MNelson ChimesM.D.   On: 10/20/2016 21:31    Assessment/Plan:  He has a residual moderate sized loculated right pleural effusion with complete collapse of the RLL and partial collapse of the RML on CT. The fluid  is exudative. It is unclear if he developed some pneumonia and this is parapneumonic or if he could have broken a rib with coughing and bled into the pleural space, stimulating the effusion. It needs to be drained to allow complete expansion of the lung and to prevent infection of the fluid. This will require VATS or mini-thoracotomy for drainage. I discussed the procedure with the patient including alternatives, benefits and risks including but not limited to bleeding, infection, lung injury, air leak and recurrent effusion. He understands and agrees to proceed. I will plan to do in the morning, 7:30 am.  I spent 60 minutes performing this consultation and > 50% of this time was spent face to face counseling and coordinating the care of this patient's right empyema.  BGaye Pollack4/19/2018, 7:04 PM

## 2016-10-21 NOTE — Progress Notes (Signed)
Patient arrived on the unit from [facility] through [facility], assessment completed see flowsheet, patient oriented to room and staff, placed on tele CCMD notified, bed in lowest position, call bell within reach will continue to monitor.

## 2016-10-22 ENCOUNTER — Inpatient Hospital Stay (HOSPITAL_COMMUNITY): Payer: Self-pay | Admitting: Anesthesiology

## 2016-10-22 ENCOUNTER — Inpatient Hospital Stay (HOSPITAL_COMMUNITY): Payer: Self-pay

## 2016-10-22 ENCOUNTER — Encounter (HOSPITAL_COMMUNITY)
Admission: EM | Disposition: A | Discharge status: Home / Self Care | Payer: Self-pay | Source: Home / Self Care | Attending: Internal Medicine

## 2016-10-22 DIAGNOSIS — J869 Pyothorax without fistula: Secondary | ICD-10-CM | POA: Diagnosis present

## 2016-10-22 DIAGNOSIS — J690 Pneumonitis due to inhalation of food and vomit: Secondary | ICD-10-CM

## 2016-10-22 HISTORY — PX: VIDEO ASSISTED THORACOSCOPY (VATS)/THOROCOTOMY: SHX6173

## 2016-10-22 LAB — GLUCOSE, CAPILLARY
GLUCOSE-CAPILLARY: 124 mg/dL — AB (ref 65–99)
GLUCOSE-CAPILLARY: 139 mg/dL — AB (ref 65–99)
GLUCOSE-CAPILLARY: 128 mg/dL — AB (ref 65–99)
Glucose-Capillary: 112 mg/dL — ABNORMAL HIGH (ref 65–99)

## 2016-10-22 LAB — PREPARE RBC (CROSSMATCH)

## 2016-10-22 LAB — LEGIONELLA PNEUMOPHILA SEROGP 1 UR AG: L. pneumophila Serogp 1 Ur Ag: NEGATIVE

## 2016-10-22 LAB — ABO/RH: ABO/RH(D): A POS

## 2016-10-22 LAB — GRAM STAIN

## 2016-10-22 SURGERY — VIDEO ASSISTED THORACOSCOPY (VATS)/THOROCOTOMY
Anesthesia: General | Site: Chest | Laterality: Right

## 2016-10-22 MED ORDER — DIPHENHYDRAMINE HCL 12.5 MG/5ML PO ELIX: 12.5000 mg | ORAL_SOLUTION | Freq: Four times a day (QID) | ORAL | Status: DC | PRN

## 2016-10-22 MED ORDER — SUGAMMADEX SODIUM 200 MG/2ML IV SOLN
INTRAVENOUS | Status: DC | PRN
  Administered 2016-10-22: 250 mg via INTRAVENOUS

## 2016-10-22 MED ORDER — DEXAMETHASONE SODIUM PHOSPHATE 10 MG/ML IJ SOLN
INTRAMUSCULAR | Status: DC | PRN
  Administered 2016-10-22: 5 mg via INTRAVENOUS

## 2016-10-22 MED ORDER — SODIUM CHLORIDE 0.9 % IV SOLN
30.0000 meq | Freq: Every day | INTRAVENOUS | Status: DC | PRN
  Filled 2016-10-22: qty 15

## 2016-10-22 MED ORDER — FENTANYL 40 MCG/ML IV SOLN
INTRAVENOUS | Status: DC
  Administered 2016-10-22: 1000 ug via INTRAVENOUS
  Administered 2016-10-22: 210 ug via INTRAVENOUS
  Administered 2016-10-22: 90 ug via INTRAVENOUS
  Administered 2016-10-23: 105 ug via INTRAVENOUS
  Administered 2016-10-23: 60 ug via INTRAVENOUS
  Administered 2016-10-23: 120 ug via INTRAVENOUS
  Administered 2016-10-23: 220 ug via INTRAVENOUS
  Administered 2016-10-23: 1000 ug via INTRAVENOUS
  Administered 2016-10-23: 160 ug via INTRAVENOUS
  Administered 2016-10-24: 75 ug via INTRAVENOUS
  Administered 2016-10-24: 90 ug via INTRAVENOUS
  Filled 2016-10-22 (×2): qty 25

## 2016-10-22 MED ORDER — PROPOFOL 10 MG/ML IV BOLUS
INTRAVENOUS | Status: AC
  Filled 2016-10-22: qty 20

## 2016-10-22 MED ORDER — ENOXAPARIN SODIUM 40 MG/0.4ML ~~LOC~~ SOLN
40.0000 mg | Freq: Every day | SUBCUTANEOUS | Status: DC
  Administered 2016-10-23 – 2016-10-26 (×4): 40 mg via SUBCUTANEOUS
  Filled 2016-10-22 (×4): qty 0.4

## 2016-10-22 MED ORDER — PROMETHAZINE HCL 25 MG/ML IJ SOLN: 6.2500 mg | INTRAMUSCULAR | Status: DC | PRN

## 2016-10-22 MED ORDER — ACETAMINOPHEN 160 MG/5ML PO SOLN
1000.0000 mg | Freq: Four times a day (QID) | ORAL | Status: DC
  Administered 2016-10-23 (×2): 1000 mg via ORAL
  Filled 2016-10-22 (×3): qty 40.6

## 2016-10-22 MED ORDER — MEPERIDINE HCL 25 MG/ML IJ SOLN: 6.2500 mg | INTRAMUSCULAR | Status: DC | PRN

## 2016-10-22 MED ORDER — ROCURONIUM BROMIDE 10 MG/ML (PF) SYRINGE
PREFILLED_SYRINGE | INTRAVENOUS | Status: AC
  Filled 2016-10-22: qty 5

## 2016-10-22 MED ORDER — DEXAMETHASONE SODIUM PHOSPHATE 10 MG/ML IJ SOLN
INTRAMUSCULAR | Status: AC
  Filled 2016-10-22: qty 1

## 2016-10-22 MED ORDER — PROPOFOL 10 MG/ML IV BOLUS
INTRAVENOUS | Status: DC | PRN
  Administered 2016-10-22: 200 mg via INTRAVENOUS

## 2016-10-22 MED ORDER — FENTANYL CITRATE (PF) 100 MCG/2ML IJ SOLN
INTRAMUSCULAR | Status: DC | PRN
  Administered 2016-10-22: 25 ug via INTRAVENOUS
  Administered 2016-10-22 (×5): 50 ug via INTRAVENOUS
  Administered 2016-10-22: 25 ug via INTRAVENOUS
  Administered 2016-10-22 (×2): 50 ug via INTRAVENOUS

## 2016-10-22 MED ORDER — DIPHENHYDRAMINE HCL 50 MG/ML IJ SOLN: 12.5000 mg | Freq: Four times a day (QID) | INTRAMUSCULAR | Status: DC | PRN

## 2016-10-22 MED ORDER — INSULIN ASPART 100 UNIT/ML ~~LOC~~ SOLN
0.0000 [IU] | SUBCUTANEOUS | Status: DC
  Administered 2016-10-22 – 2016-10-25 (×5): 2 [IU] via SUBCUTANEOUS

## 2016-10-22 MED ORDER — ONDANSETRON HCL 4 MG/2ML IJ SOLN: 4.0000 mg | Freq: Four times a day (QID) | INTRAMUSCULAR | Status: DC | PRN

## 2016-10-22 MED ORDER — SODIUM CHLORIDE 0.9 % IR SOLN
Status: DC | PRN
  Administered 2016-10-22: 2000 mL

## 2016-10-22 MED ORDER — SUCCINYLCHOLINE CHLORIDE 200 MG/10ML IV SOSY
PREFILLED_SYRINGE | INTRAVENOUS | Status: AC
  Filled 2016-10-22: qty 10

## 2016-10-22 MED ORDER — FENTANYL CITRATE (PF) 250 MCG/5ML IJ SOLN
INTRAMUSCULAR | Status: AC
  Filled 2016-10-22: qty 5

## 2016-10-22 MED ORDER — PHENYLEPHRINE 40 MCG/ML (10ML) SYRINGE FOR IV PUSH (FOR BLOOD PRESSURE SUPPORT)
PREFILLED_SYRINGE | INTRAVENOUS | Status: AC
  Filled 2016-10-22: qty 10

## 2016-10-22 MED ORDER — HYDROMORPHONE HCL 1 MG/ML IJ SOLN
0.2500 mg | INTRAMUSCULAR | Status: DC | PRN
  Administered 2016-10-22 (×2): 0.5 mg via INTRAVENOUS

## 2016-10-22 MED ORDER — NALOXONE HCL 0.4 MG/ML IJ SOLN
0.4000 mg | INTRAMUSCULAR | Status: DC | PRN
  Filled 2016-10-22: qty 1

## 2016-10-22 MED ORDER — LACTATED RINGERS IV SOLN
INTRAVENOUS | Status: DC | PRN
  Administered 2016-10-22: 07:00:00 via INTRAVENOUS

## 2016-10-22 MED ORDER — ROCURONIUM BROMIDE 100 MG/10ML IV SOLN
INTRAVENOUS | Status: DC | PRN
  Administered 2016-10-22: 50 mg via INTRAVENOUS
  Administered 2016-10-22: 20 mg via INTRAVENOUS
  Administered 2016-10-22 (×2): 10 mg via INTRAVENOUS
  Administered 2016-10-22: 30 mg via INTRAVENOUS

## 2016-10-22 MED ORDER — LIDOCAINE 2% (20 MG/ML) 5 ML SYRINGE
INTRAMUSCULAR | Status: AC
  Filled 2016-10-22: qty 5

## 2016-10-22 MED ORDER — MIDAZOLAM HCL 5 MG/5ML IJ SOLN
INTRAMUSCULAR | Status: DC | PRN
  Administered 2016-10-22: 2 mg via INTRAVENOUS

## 2016-10-22 MED ORDER — SODIUM CHLORIDE 0.9 % IV SOLN: Freq: Once | INTRAVENOUS | Status: DC

## 2016-10-22 MED ORDER — ONDANSETRON HCL 4 MG/2ML IJ SOLN
INTRAMUSCULAR | Status: DC | PRN
  Administered 2016-10-22: 4 mg via INTRAVENOUS

## 2016-10-22 MED ORDER — SODIUM CHLORIDE 0.9% FLUSH: 9.0000 mL | INTRAVENOUS | Status: DC | PRN

## 2016-10-22 MED ORDER — ACETAMINOPHEN 500 MG PO TABS
1000.0000 mg | ORAL_TABLET | Freq: Four times a day (QID) | ORAL | Status: DC
  Administered 2016-10-22 – 2016-10-26 (×8): 1000 mg via ORAL
  Filled 2016-10-22 (×10): qty 2

## 2016-10-22 MED ORDER — KCL IN DEXTROSE-NACL 20-5-0.9 MEQ/L-%-% IV SOLN
INTRAVENOUS | Status: DC
  Administered 2016-10-22 – 2016-10-23 (×2): via INTRAVENOUS
  Filled 2016-10-22 (×3): qty 1000

## 2016-10-22 MED ORDER — HYDROMORPHONE HCL 1 MG/ML IJ SOLN
INTRAMUSCULAR | Status: AC
  Filled 2016-10-22: qty 0.5

## 2016-10-22 MED ORDER — LACTATED RINGERS IV SOLN
INTRAVENOUS | Status: DC
  Administered 2016-10-22: 14:00:00 via INTRAVENOUS

## 2016-10-22 MED ORDER — MIDAZOLAM HCL 2 MG/2ML IJ SOLN
INTRAMUSCULAR | Status: AC
  Filled 2016-10-22: qty 2

## 2016-10-22 MED ORDER — BISACODYL 5 MG PO TBEC
10.0000 mg | DELAYED_RELEASE_TABLET | Freq: Every day | ORAL | Status: DC
  Administered 2016-10-23 – 2016-10-26 (×3): 10 mg via ORAL
  Filled 2016-10-22 (×4): qty 2

## 2016-10-22 MED ORDER — SENNOSIDES-DOCUSATE SODIUM 8.6-50 MG PO TABS
1.0000 | ORAL_TABLET | Freq: Every day | ORAL | Status: DC
  Administered 2016-10-23 – 2016-10-25 (×3): 1 via ORAL
  Filled 2016-10-22 (×3): qty 1

## 2016-10-22 MED ORDER — LUNG SURGERY BOOK
Freq: Once | Status: AC
  Administered 2016-10-22: 17:00:00
  Filled 2016-10-22: qty 1

## 2016-10-22 MED ORDER — ONDANSETRON HCL 4 MG/2ML IJ SOLN
INTRAMUSCULAR | Status: AC
  Filled 2016-10-22: qty 2

## 2016-10-22 MED ORDER — TRAMADOL HCL 50 MG PO TABS: 50.0000 mg | ORAL_TABLET | Freq: Four times a day (QID) | ORAL | Status: DC | PRN

## 2016-10-22 MED ORDER — OXYCODONE HCL 5 MG PO TABS
5.0000 mg | ORAL_TABLET | ORAL | Status: DC | PRN
  Administered 2016-10-23 – 2016-10-26 (×10): 10 mg via ORAL
  Filled 2016-10-22 (×10): qty 2

## 2016-10-22 SURGICAL SUPPLY — 85 items
ADH SKN CLS APL DERMABOND .7 (GAUZE/BANDAGES/DRESSINGS)
APL SKNCLS STERI-STRIP NONHPOA (GAUZE/BANDAGES/DRESSINGS)
APL SRG 22X2 LUM MLBL SLNT (VASCULAR PRODUCTS)
APL SRG 7X2 LUM MLBL SLNT (VASCULAR PRODUCTS)
APPLICATOR TIP COSEAL (VASCULAR PRODUCTS) IMPLANT
APPLICATOR TIP EXT COSEAL (VASCULAR PRODUCTS) IMPLANT
BENZOIN TINCTURE PRP APPL 2/3 (GAUZE/BANDAGES/DRESSINGS) IMPLANT
CANISTER SUCT 3000ML PPV (MISCELLANEOUS) ×6 IMPLANT
CATH KIT ON Q 5IN SLV (PAIN MANAGEMENT) IMPLANT
CATH THOR STR 32F SOFT 20 RADI (CATHETERS) ×2 IMPLANT
CATH THORACIC 28FR (CATHETERS) IMPLANT
CATH THORACIC 36FR (CATHETERS) IMPLANT
CATH THORACIC 36FR RT ANG (CATHETERS) IMPLANT
CLEANER TIP ELECTROSURG 2X2 (MISCELLANEOUS) ×3 IMPLANT
CLIP TI MEDIUM 6 (CLIP) ×3 IMPLANT
CONN ST 1/4X3/8  BEN (MISCELLANEOUS)
CONN ST 1/4X3/8 BEN (MISCELLANEOUS) IMPLANT
CONN Y 3/8X3/8X3/8  BEN (MISCELLANEOUS)
CONN Y 3/8X3/8X3/8 BEN (MISCELLANEOUS) IMPLANT
CONT SPEC 4OZ CLIKSEAL STRL BL (MISCELLANEOUS) ×6 IMPLANT
COVER SURGICAL LIGHT HANDLE (MISCELLANEOUS) ×6 IMPLANT
DERMABOND ADVANCED (GAUZE/BANDAGES/DRESSINGS)
DERMABOND ADVANCED .7 DNX12 (GAUZE/BANDAGES/DRESSINGS) IMPLANT
DRAIN CHANNEL 28F RND 3/8 FF (WOUND CARE) IMPLANT
DRAIN CHANNEL 32F RND 10.7 FF (WOUND CARE) ×2 IMPLANT
DRAPE LAPAROSCOPIC ABDOMINAL (DRAPES) ×3 IMPLANT
DRAPE WARM FLUID 44X44 (DRAPE) ×3 IMPLANT
DRILL BIT 7/64X5 (BIT) IMPLANT
ELECT REM PT RETURN 9FT ADLT (ELECTROSURGICAL) ×3
ELECTRODE REM PT RTRN 9FT ADLT (ELECTROSURGICAL) ×1 IMPLANT
GAUZE SPONGE 4X4 12PLY STRL (GAUZE/BANDAGES/DRESSINGS) ×3 IMPLANT
GAUZE SPONGE 4X4 12PLY STRL LF (GAUZE/BANDAGES/DRESSINGS) ×2 IMPLANT
GLOVE BIO SURGEON STRL SZ 6.5 (GLOVE) ×4 IMPLANT
GLOVE BIO SURGEONS STRL SZ 6.5 (GLOVE) ×2
GLOVE BIOGEL PI IND STRL 6.5 (GLOVE) IMPLANT
GLOVE BIOGEL PI INDICATOR 6.5 (GLOVE) ×2
GLOVE EUDERMIC 7 POWDERFREE (GLOVE) ×6 IMPLANT
GOWN STRL REUS W/ TWL LRG LVL3 (GOWN DISPOSABLE) ×4 IMPLANT
GOWN STRL REUS W/ TWL XL LVL3 (GOWN DISPOSABLE) ×1 IMPLANT
GOWN STRL REUS W/TWL LRG LVL3 (GOWN DISPOSABLE) ×12
GOWN STRL REUS W/TWL XL LVL3 (GOWN DISPOSABLE) ×3
KIT BASIN OR (CUSTOM PROCEDURE TRAY) ×3 IMPLANT
KIT ROOM TURNOVER OR (KITS) ×3 IMPLANT
KIT SUCTION CATH 14FR (SUCTIONS) ×3 IMPLANT
NS IRRIG 1000ML POUR BTL (IV SOLUTION) ×10 IMPLANT
PACK CHEST (CUSTOM PROCEDURE TRAY) ×3 IMPLANT
PAD ARMBOARD 7.5X6 YLW CONV (MISCELLANEOUS) ×6 IMPLANT
SEALANT SURG COSEAL 4ML (VASCULAR PRODUCTS) IMPLANT
SEALANT SURG COSEAL 8ML (VASCULAR PRODUCTS) IMPLANT
SOLUTION ANTI FOG 6CC (MISCELLANEOUS) ×3 IMPLANT
SUT PROLENE 3 0 SH DA (SUTURE) IMPLANT
SUT PROLENE 4 0 RB 1 (SUTURE)
SUT PROLENE 4-0 RB1 .5 CRCL 36 (SUTURE) IMPLANT
SUT SILK  1 MH (SUTURE) ×6
SUT SILK 1 MH (SUTURE) ×2 IMPLANT
SUT SILK 1 TIES 10X30 (SUTURE) IMPLANT
SUT SILK 2 0 SH (SUTURE) ×6 IMPLANT
SUT SILK 2 0SH CR/8 30 (SUTURE) IMPLANT
SUT SILK 3 0SH CR/8 30 (SUTURE) IMPLANT
SUT VIC AB 1 CTX 18 (SUTURE) IMPLANT
SUT VIC AB 1 CTX 36 (SUTURE) ×3
SUT VIC AB 1 CTX36XBRD ANBCTR (SUTURE) ×1 IMPLANT
SUT VIC AB 2-0 CT1 27 (SUTURE)
SUT VIC AB 2-0 CT1 TAPERPNT 27 (SUTURE) IMPLANT
SUT VIC AB 2-0 CTX 36 (SUTURE) ×5 IMPLANT
SUT VIC AB 2-0 UR6 27 (SUTURE) IMPLANT
SUT VIC AB 3-0 MH 27 (SUTURE) IMPLANT
SUT VIC AB 3-0 SH 27 (SUTURE)
SUT VIC AB 3-0 SH 27X BRD (SUTURE) IMPLANT
SUT VIC AB 3-0 X1 27 (SUTURE) ×3 IMPLANT
SUT VICRYL 2 TP 1 (SUTURE) ×5 IMPLANT
SWAB COLLECTION DEVICE MRSA (MISCELLANEOUS) IMPLANT
SWAB CULTURE ESWAB REG 1ML (MISCELLANEOUS) IMPLANT
SYSTEM SAHARA CHEST DRAIN ATS (WOUND CARE) ×3 IMPLANT
TAPE CLOTH 4X10 WHT NS (GAUZE/BANDAGES/DRESSINGS) ×3 IMPLANT
TAPE CLOTH SURG 4X10 WHT LF (GAUZE/BANDAGES/DRESSINGS) ×2 IMPLANT
TIP APPLICATOR SPRAY EXTEND 16 (VASCULAR PRODUCTS) IMPLANT
TOWEL GREEN STERILE (TOWEL DISPOSABLE) ×12 IMPLANT
TOWEL GREEN STERILE FF (TOWEL DISPOSABLE) ×6 IMPLANT
TOWEL OR 17X24 6PK STRL BLUE (TOWEL DISPOSABLE) ×3 IMPLANT
TOWEL OR 17X26 10 PK STRL BLUE (TOWEL DISPOSABLE) ×6 IMPLANT
TRAP SPECIMEN MUCOUS 40CC (MISCELLANEOUS) IMPLANT
TRAY FOLEY W/METER SILVER 14FR (SET/KITS/TRAYS/PACK) ×3 IMPLANT
TUNNELER SHEATH ON-Q 11GX8 DSP (PAIN MANAGEMENT) IMPLANT
WATER STERILE IRR 1000ML POUR (IV SOLUTION) ×6 IMPLANT

## 2016-10-22 NOTE — Anesthesia Postprocedure Evaluation (Addendum)
Anesthesia Post Note  Patient: Cline Draheim Dimmick  Procedure(s) Performed: Procedure(s) (LRB): VIDEO ASSISTED THORACOSCOPY (VATS)/THOROCOTOMY for Empyema  (Right)  Patient location during evaluation: PACU Anesthesia Type: General Level of consciousness: awake and alert Pain management: pain level controlled Vital Signs Assessment: post-procedure vital signs reviewed and stable Respiratory status: spontaneous breathing, nonlabored ventilation, respiratory function stable and patient connected to nasal cannula oxygen Cardiovascular status: blood pressure returned to baseline and stable Postop Assessment: no signs of nausea or vomiting Anesthetic complications: no       Last Vitals:  Vitals:   10/22/16 1131 10/22/16 1200  BP: 101/82   Pulse: 76   Resp: 18   Temp:  36.8 C    Last Pain:  Vitals:   10/22/16 1104  TempSrc:   PainSc: 4                  Shelton Silvas

## 2016-10-22 NOTE — Anesthesia Procedure Notes (Signed)
Central Venous Catheter Insertion Performed by: Jaymie Misch, anesthesiologist Patient location: Pre-op. Preanesthetic checklist: patient identified, IV checked, site marked, risks and benefits discussed, surgical consent, monitors and equipment checked, pre-op evaluation, timeout performed and anesthesia consent Position: Trendelenburg Lidocaine 1% used for infiltration and patient sedated Hand hygiene performed , maximum sterile barriers used  and Seldinger technique used Catheter size: 8 Fr Total catheter length 16. Central line was placed.Double lumen Procedure performed using ultrasound guided technique. Ultrasound Notes:anatomy identified, needle tip was noted to be adjacent to the nerve/plexus identified, no ultrasound evidence of intravascular and/or intraneural injection and image(s) printed for medical record Attempts: 1 Following insertion, dressing applied, line sutured and Biopatch. Post procedure assessment: blood return through all ports, free fluid flow and no air  Patient tolerated the procedure well with no immediate complications.          

## 2016-10-22 NOTE — Brief Op Note (Signed)
10/19/2016 - 10/22/2016  9:37 AM  PATIENT:  Gerald Wheeler  56 y.o. male  PRE-OPERATIVE DIAGNOSIS:  RIGHT EMPYEMA  POST-OPERATIVE DIAGNOSIS:  RIGHT EMPYEMA  PROCEDURE:  Procedure(s): VIDEO ASSISTED THORACOSCOPY (VATS)/EMPYEMA (Right) THORACOTOMY MAJOR (Right)  SURGEON:  Surgeon(s) and Role:    * Alleen Borne, MD - Primary  PHYSICIAN ASSISTANT: Gershon Crane, PA-C  Findings:  Loculated empyema, small superficial lung abscess in RLL that had eroded into pleural space.  ANESTHESIA:   general  EBL:  Total I/O In: -  Out: 350 [Urine:350]  BLOOD ADMINISTERED:none  DRAINS: 32 F chest tube posteriorly to the apex and 32 F Blake drain inferiorly in sulcus   LOCAL MEDICATIONS USED:  NONE  SPECIMEN:  Source of Specimen:  loculated pleural fluid and peel for routine, anaerobic, fungus and AFB  DISPOSITION OF SPECIMEN:  micro  COUNTS:  YES  TOURNIQUET:  * No tourniquets in log *  DICTATION: .Note written in EPIC  PLAN OF CARE: Admit to inpatient   PATIENT DISPOSITION:  PACU - hemodynamically stable.   Delay start of Pharmacological VTE agent (>24hrs) due to surgical blood loss or risk of bleeding: no

## 2016-10-22 NOTE — Progress Notes (Addendum)
PROGRESS NOTE  Gerald Wheeler WUJ:[id number] DOB: July 31, 1960 DOA: 10/19/2016 PCP: [name], FNP  HPI/Recap of past 31 hours: 56 year old male past history of hypertension and diabetes seen in the emergency room on 4/17 for progressively worsening cough vitamin going now on for several weeks. Patient started experiencing dyspnea on exertion which continued to worsen with no relief despite cough suppressant. In the emergency room, found to have a community acquired pneumonia plus large right-sided pleural effusion.  This morning, patient white blood cell count increased from 11-13. No fever. Taken for thoracentesis and 0.5 L of fluid removed noting almost 16,000 white blood cells with 61% neutrophils. Gram stain-no organisms seen. Serum LDH at 930.  With potential empyema and certainly exudative effusion, patient sent for CT scan of chest which noted some mild areas of loculation. Discussed with cardiothoracic surgery who after reviewing CTS are recommended VATS procedure 4/20 .    Assessment/Plan:     Community-acquired Right lower lobe pneumonia (HCC) complicated by right sided pleural effusion: Status post diagnostic thoracentesis. Discussed with pulmonary. While Gram stain negative, significant white count and elevated LDH certainly concerning for exudative effusion/potential empyema. CT scan notes areas of loculation, although not a lot of fluid. After reviewing CT, cardiothoracic surgery has performed VATS  4/20 Now has a chest tube Appreciate Alleen Borne, MD, assistance      Essential hypertension: Blood pressure soft after surgery. Discontinued lisinopril and Norvasc, continue metoprolol    Tobacco abuse: Nicotine patch    Diabetes mellitus type 2, noninsulin dependent (HCC): Sliding scale. CBGs stable    Leukocytosis, likely secondary to #1, will follow    Hyponatremia: Secondary dehydration.  sodium 134 on 4/18    Microcytic anemia: Preop hemoglobin 12, baseline 12.7,  will recheck tomorrow    Acute respiratory failure with hypoxia (HCC): Secondary to pneumonia. Oxygen support, currently on 2 L    Code Status: Full code   Family Communication: Multiple family members at the bedside   Disposition Plan:   Anticipate  patient will be here for another 3-4 days   Consultants:  Discussed with pulmonary   Interventional radiology  Cardiothoracic surgery  Procedures:  Status post thoracentesis done 4/18   Right video assisted thoracoscopy 4/20  Antimicrobials:  IV Rocephin and Zithromax 4/16-present   DVT prophylaxis:  Lovenox   Objective: Vitals:   10/22/16 1115 10/22/16 1130 10/22/16 1131 10/22/16 1200  BP:   101/82   Pulse: 76 77 76   Resp: 20 (!) 23 18   Temp:    98.3 F (36.8 C)  TempSrc:      SpO2: 94% 93% 93%   Weight:      Height:        Intake/Output Summary (Last 24 hours) at 10/22/16 1225 Last data filed at 10/22/16 1014  Gross per 24 hour  Intake              900 ml  Output             1150 ml  Net             -250 ml   Filed Weights   10/19/16 1246 10/19/16 1745  Weight: 108.9 kg (240 lb) 108.9 kg (240 lb)    Exam:   General:  Alert & oriented x 3, NAD   Cardiovascular: Regular rate and rhythm, S1-S2   Respiratory: Decreased breath sounds right mid lung down   Abdomen: Soft, nontender, non-senna, positive bowel sounds  Musculoskeletal: No clubbing or cyanosis or edema   Skin: No skin breaks, tears or lesions  Psychiatry: Patient is appropriate, no evidence of psychoses    Data Reviewed: CBC:  Recent Labs Lab 10/19/16 1258 10/20/16 0623  WBC 11.4* 13.3*  NEUTROABS  --  12.0*  HGB 12.9* 12.0*  HCT 38.6* 36.9*  MCV 71.5* 72.9*  PLT 346 342   Basic Metabolic Panel:  Recent Labs Lab 10/19/16 1258 10/20/16 0623  NA 132* 134*  K 4.5 3.8  CL 99* 102  CO2 23 21*  GLUCOSE 118* 124*  BUN 7 6  CREATININE 0.79 0.69  CALCIUM 9.2 8.8*  MG  --  1.9  PHOS  --  3.1   GFR: Estimated  Creatinine Clearance: 129 mL/min (by C-G formula based on SCr of 0.69 mg/dL). Liver Function Tests:  Recent Labs Lab 10/19/16 1258 10/20/16 0623  AST 24 13*  ALT 20 14*  ALKPHOS 59 54  BILITOT 0.4 0.6  PROT 7.6 7.5  ALBUMIN 3.3* 3.0*    Recent Labs Lab 10/19/16 1258  LIPASE 13   No results for input(s): AMMONIA in the last 168 hours. Coagulation Profile: No results for input(s): INR, PROTIME in the last 168 hours. Cardiac Enzymes:  Recent Labs Lab 10/19/16 1258  TROPONINI <0.03   BNP (last 3 results) No results for input(s): PROBNP in the last 8760 hours. HbA1C: No results for input(s): HGBA1C in the last 72 hours. CBG:  Recent Labs Lab 10/21/16 0746 10/21/16 1153 10/21/16 1650 10/22/16 0559 10/22/16 1032  GLUCAP 117* 146* 185* 124* 139*   Lipid Profile: No results for input(s): CHOL, HDL, LDLCALC, TRIG, CHOLHDL, LDLDIRECT in the last 72 hours. Thyroid Function Tests: No results for input(s): TSH, T4TOTAL, FREET4, T3FREE, THYROIDAB in the last 72 hours. Anemia Panel: No results for input(s): VITAMINB12, FOLATE, FERRITIN, TIBC, IRON, RETICCTPCT in the last 72 hours. Urine analysis:    Component Value Date/Time   COLORURINE YELLOW 10/20/2016 0035   APPEARANCEUR CLEAR 10/20/2016 0035   LABSPEC 1.011 10/20/2016 0035   PHURINE 6.0 10/20/2016 0035   GLUCOSEU NEGATIVE 10/20/2016 0035   HGBUR NEGATIVE 10/20/2016 0035   BILIRUBINUR NEGATIVE 10/20/2016 0035   KETONESUR 5 (A) 10/20/2016 0035   PROTEINUR NEGATIVE 10/20/2016 0035   NITRITE NEGATIVE 10/20/2016 0035   LEUKOCYTESUR NEGATIVE 10/20/2016 0035   Sepsis Labs: (procalcitonin:4,lacticidven:4)  ) Recent Results (from the past 240 hour(s))  Culture, blood (routine x 2) Call MD if unable to obtain prior to antibiotics being given     Status: None (Preliminary result)   Collection Time: 10/19/16  7:42 PM  Result Value Ref Range Status   Specimen Description BLOOD BLOOD LEFT ARM  Final    Special Requests   Final    BOTTLES DRAWN AEROBIC ONLY Blood Culture adequate volume   Culture   Final    NO GROWTH 2 DAYS Performed at [facility], [address] [place], [place] [address]    Report Status PENDING  Incomplete  Culture, blood (routine x 2) Call MD if unable to obtain prior to antibiotics being given     Status: None (Preliminary result)   Collection Time: 10/19/16  7:45 PM  Result Value Ref Range Status   Specimen Description BLOOD BLOOD LEFT ARM  Final   Special Requests   Final    BOTTLES DRAWN AEROBIC ONLY Blood Culture adequate volume   Culture   Final    NO GROWTH 2 DAYS Performed at [facility]  Lab, [address] [place], [place] [address]    Report Status PENDING  Incomplete  Respiratory Panel by PCR     Status: None   Collection Time: 10/20/16  7:34 AM  Result Value Ref Range Status   Adenovirus NOT DETECTED NOT DETECTED Final   Coronavirus 229E NOT DETECTED NOT DETECTED Final   Coronavirus HKU1 NOT DETECTED NOT DETECTED Final   Coronavirus NL63 NOT DETECTED NOT DETECTED Final   Coronavirus OC43 NOT DETECTED NOT DETECTED Final   Metapneumovirus NOT DETECTED NOT DETECTED Final   Rhinovirus / Enterovirus NOT DETECTED NOT DETECTED Final   Influenza A NOT DETECTED NOT DETECTED Final   Influenza B NOT DETECTED NOT DETECTED Final   Parainfluenza Virus 1 NOT DETECTED NOT DETECTED Final   Parainfluenza Virus 2 NOT DETECTED NOT DETECTED Final   Parainfluenza Virus 3 NOT DETECTED NOT DETECTED Final   Parainfluenza Virus 4 NOT DETECTED NOT DETECTED Final   Respiratory Syncytial Virus NOT DETECTED NOT DETECTED Final   Bordetella pertussis NOT DETECTED NOT DETECTED Final   Chlamydophila pneumoniae NOT DETECTED NOT DETECTED Final   Mycoplasma pneumoniae NOT DETECTED NOT DETECTED Final    Comment: Performed at [facility], [address], [place], [place] [address]  Culture, body fluid-bottle     Status: None (Preliminary result)   Collection  Time: 10/20/16  1:01 PM  Result Value Ref Range Status   Specimen Description FLUID RIGHT PLEURAL  Final   Special Requests NONE  Final   Culture   Final    NO GROWTH < 24 HOURS Performed at [facility], [address], [place], [place] [address]    Report Status PENDING  Incomplete  Gram stain     Status: None   Collection Time: 10/20/16  1:01 PM  Result Value Ref Range Status   Specimen Description FLUID RIGHT PLEURAL  Final   Special Requests NONE  Final   Gram Stain   Final    ABUNDANT WBC PRESENT,BOTH PMN AND MONONUCLEAR NO ORGANISMS SEEN Performed at [facility], [address], [place], [place] [address]    Report Status 10/20/2016 FINAL  Final  Culture, sputum-assessment     Status: None   Collection Time: 10/20/16  4:09 PM  Result Value Ref Range Status   Specimen Description SPUTUM  Final   Special Requests NONE  Final   Sputum evaluation THIS SPECIMEN IS ACCEPTABLE FOR SPUTUM CULTURE  Final   Report Status 10/20/2016 FINAL  Final  Culture, respiratory (NON-Expectorated)     Status: None (Preliminary result)   Collection Time: 10/20/16  4:09 PM  Result Value Ref Range Status   Specimen Description SPUTUM  Final   Special Requests NONE Reflexed from [id number]  Final   Gram Stain   Final    RARE WBC PRESENT, PREDOMINANTLY PMN RARE GRAM POSITIVE COCCI IN PAIRS RARE GRAM POSITIVE RODS    Culture   Final    TOO YOUNG TO READ Performed at [facility], [address], [place], [place] [address]    Report Status PENDING  Incomplete  Gram stain     Status: None   Collection Time: 10/22/16  8:36 AM  Result Value Ref Range Status   Specimen Description PLEURAL RIGHT  Final   Special Requests NONE  Final   Gram Stain   Final    MODERATE WBC PRESENT,BOTH PMN AND MONONUCLEAR NO ORGANISMS SEEN    Report Status 10/22/2016 FINAL  Final  Studies: Dg Chest Port 1 View  Result Date: 10/22/2016 CLINICAL DATA:  Post op central line Lt chest tube.  Empyema EXAM: PORTABLE CHEST 1 VIEW COMPARISON:  CT and chest radiograph, 10/20/2016 FINDINGS: Since prior studies, 2 right-sided chest tubes have been inserted. Both have their tips at the right apex. The majority of the right pleural fluid has been evacuated. There is residual discoid and linear opacity at the right lung base consistent with atelectasis. Remainder the right lung is clear. No right pneumothorax. Mild linear left lung base opacity is noted, stable, consistent with atelectasis. Left lung otherwise clear. No left pleural effusion. New right internal jugular central venous line has its tip in the lower superior vena cava. IMPRESSION: 1. Right internal jugular central venous line tip projects in the lower superior vena cava approximately 3 cm above the caval atrial junction. 2. Two right chest tubes have their tips at the right apex. No pneumothorax. There has been significant improvement in right lung aeration with near complete evacuation of the right pleural fluid and significant improvement in right lung base atelectasis. Electronically Signed   By: Amie Portland M.D.   On: 10/22/2016 10:26    Scheduled Meds: . [MAR Hold] amLODipine  5 mg Oral Daily  . [MAR Hold] enoxaparin (LOVENOX) injection  40 mg Subcutaneous Q24H  . fentaNYL   Intravenous Q4H  . [MAR Hold] gabapentin  400 mg Oral TID  . HYDROmorphone      . HYDROmorphone      . [MAR Hold] insulin aspart  0-9 Units Subcutaneous TID WC  . [MAR Hold] ipratropium-albuterol  3 mL Nebulization QID  . [MAR Hold] lisinopril  40 mg Oral Daily  . [MAR Hold] loratadine  10 mg Oral Daily  . [MAR Hold] methylPREDNISolone (SOLU-MEDROL) injection  40 mg Intravenous Q12H  . [MAR Hold] metoprolol tartrate  25 mg Oral Daily  . [MAR Hold] nicotine  7 mg Transdermal Daily  . [MAR Hold] pravastatin  20 mg Oral q1800    Continuous Infusions: . sodium chloride 100 mL/hr at 10/21/16 2150  . sodium chloride    . sodium chloride    . [MAR Hold]  azithromycin Stopped (10/21/16 2109)  . [MAR Hold] cefTRIAXone (ROCEPHIN)  IV Stopped (10/21/16 1501)  . lactated ringers       LOS: 3 days     Richarda Overlie, MD [facility] Pager [phone] If 7PM-7AM, please contact night-coverage [email or web address] Password [email or web address] 10/22/2016, 12:25 PM

## 2016-10-22 NOTE — Op Note (Signed)
10/22/2016 Gerald Wheeler [id number]  Surgeon: Alleen Borne, MD   First Assistant: Gershon Crane, PA-C  Preoperative Diagnosis: Right empyema   Postoperative Diagnosis: Right empyema   Procedure:  1. Right video assisted thoracoscopy 2. Right muscle-sparing thoracotomy  3. Drainage of empyema  4. Decortication of the right lung   Anesthesia: General Endotracheal   Clinical History/Surgical Indication:   The patient is a 56 year old gentleman with hypertension, diabetes, hypercholesterolemia, and smoking who reports being in his usual state of health until Tuesday morning. He has a history of asthma and allergies. He said that he walked a couple miles on Monday evening and felt fine but Tuesday morning he had a bad coughing episode and then had severe right chest wall pain. He thought he broke a rib. He started developing shortness of breath after this but went to work. His breathing worsened so he went to [facility] ER. CXR showed a moderate sized right pleural effusion. He had a thoracentesis yesterday removing 500 cc of slightly cloudy yellow fluid that was exudative. Gram stain negative, culture negative.  CT of the chest afterwards showed a partially loculated moderate right pleural effusion with complete collapse of the RLL, partial collapse of the middle lobe. He feels better since the thoracentesis with improvement in his chest pain.  He has a residual moderate sized loculated right pleural effusion with complete collapse of the RLL and partial collapse of the RML on CT. The fluid is exudative. It is unclear if he developed some pneumonia and this is parapneumonic or if he could have broken a rib with coughing and bled into the pleural space, stimulating the effusion. It needs to be drained to allow complete expansion of the lung and to prevent infection of the fluid. This will require VATS or mini-thoracotomy for drainage. I discussed the procedure with the patient including alternatives,  benefits and risks including but not limited to bleeding, infection, lung injury, air leak and recurrent effusion. He understands and agrees to proceed.   Preparation:  The patient was seen in the preoperative holding area and the correct patient, correct operation, correct operative sidewere confirmed with the patient after reviewing the medical record and CT scan. The consent was signed by me. Preoperative antibiotics were given. Theright side of the chest was signed by me. The patient was taken back to the operating room and positioned supine on the operating room table. After being placed under general endotracheal anesthesia by the anesthesia team using a double lumen tube a foley catheter was placed. The patient was turned into the left lateral decubitus position. The chest was prepped with betadine soap and solution. A surgical time-out was taken and the correct patient,operative side, and operative procedure were confirmed with the nursing and anesthesia staff.   Operative Procedure:  A 1 cm incision was made in the mid-axillary line at about the 8th intercostal space and an 8 mm trocar was inserted into the pleural space. It was immediately apparent that the pleural space was obliterated by the multi-loculated empyema and that this could not be done effectively by the VATS approach alone. Therefore a short lateral muscle-sparing thoracotomy incision was made and the chest entered through the 6th ICS. The pleural space was filled with a yellowish green multiloculated empyema that was sero-purulent with a thin fibrinous peel over the lung. The empyema was completely drained and the right lung decorticated. The scope was used to completely visualize the entire pleural space to be sure that all  fluid collections were drained. There was a small superficial lung abscess in the RLL which was unroofed.  This allowed complete expansion of the lung. The chest was irrigated with warm saline. Hemostasis was  complete. A 32 F Bard drain was placed through a stab incision and positioned posteriorly and inferiorly in the pleural space. A 32 F chest tube was inserted through a separate stab incision and positioned posteriorly and up to the apex. The ribs were reapproximated with # 2 vicryl pericostal sutures and the muscles returned to their normal position. The subcutaneous tissue was closed with 2-0 vicryl continuous suture. The skin was closed with 3-0 vicryl subcuticular suture. All sponge, needle, and instrument counts were reported correct at the end of the case. Dry sterile dressings were placed over the incisions and around the chest tubes which were connected to pleurevac suction. The patient was turned supine, extubated,then transported to the PACU in satisfactory and stable condition.

## 2016-10-22 NOTE — Care Management Note (Signed)
Case Management Note  Patient Details  Name: LAKOTA MARKGRAF MRN: [id number] Date of Birth: 1961/03/10  Subjective/Objective:   Video assited thoracoscopy Vats Empyema, thoracotomy.                Action/Plan: NCM will follow for dc needs.  Expected Discharge Date:                  Expected Discharge Plan:  Home/Self Care  In-House Referral:     Discharge planning Services  CM Consult  Post Acute Care Choice:    Choice offered to:     DME Arranged:    DME Agency:     HH Arranged:    HH Agency:     Status of Service:  In process, will continue to follow  If discussed at Long Length of Stay Meetings, dates discussed:    Additional Comments:  Leone Haven, RN 10/22/2016, 5:02 PM

## 2016-10-22 NOTE — Anesthesia Procedure Notes (Signed)
Procedure Name: Intubation Date/Time: 10/22/2016 7:53 AM Performed by: Carney Living Pre-anesthesia Checklist: Patient identified, Emergency Drugs available, Suction available, Patient being monitored and Timeout performed Patient Re-evaluated:Patient Re-evaluated prior to inductionOxygen Delivery Method: Circle system utilized Preoxygenation: Pre-oxygenation with 100% oxygen Intubation Type: IV induction Ventilation: Mask ventilation without difficulty and Oral airway inserted - appropriate to patient size Laryngoscope Size: Mac and 4 Grade View: Grade I Tube type: Oral Endobronchial tube: Left, Double lumen EBT and EBT position confirmed by fiberoptic bronchoscope and 39 Fr Number of attempts: 1 Airway Equipment and Method: Stylet and Fiberoptic brochoscope Placement Confirmation: ETT inserted through vocal cords under direct vision,  positive ETCO2 and breath sounds checked- equal and bilateral Tube secured with: Tape Dental Injury: Teeth and Oropharynx as per pre-operative assessment  Comments: Intubation performed by Butler Denmark, SRNA

## 2016-10-22 NOTE — Progress Notes (Signed)
Patient Artline kinked and pulled out. Removed and cleaned and pressure bandage placed on it.

## 2016-10-22 NOTE — Transfer of Care (Signed)
Immediate Anesthesia Transfer of Care Note  Patient: Gerald Wheeler  Procedure(s) Performed: Procedure(s): VIDEO ASSISTED THORACOSCOPY (VATS)/THOROCOTOMY for Empyema  (Right)  Patient Location: PACU  Anesthesia Type:General  Level of Consciousness: awake, alert , oriented and patient cooperative  Airway & Oxygen Therapy: Patient Spontanous Breathing and Patient connected to face mask oxygen  Post-op Assessment: Report given to RN, Post -op Vital signs reviewed and stable and Patient moving all extremities X 4  Post vital signs: Reviewed and stable  Last Vitals:  Vitals:   10/21/16 1900 10/22/16 0458  BP: (!) 154/83 134/82  Pulse: 88 72  Resp: 18 18  Temp: 36.7 C 36.4 C    Last Pain:  Vitals:   10/22/16 0458  TempSrc: Oral  PainSc:          Complications: No apparent anesthesia complications

## 2016-10-23 ENCOUNTER — Encounter (HOSPITAL_COMMUNITY): Payer: Self-pay | Admitting: Surgery

## 2016-10-23 ENCOUNTER — Inpatient Hospital Stay (HOSPITAL_COMMUNITY): Payer: Self-pay

## 2016-10-23 DIAGNOSIS — Z9689 Presence of other specified functional implants: Secondary | ICD-10-CM

## 2016-10-23 LAB — GLUCOSE, CAPILLARY
GLUCOSE-CAPILLARY: 143 mg/dL — AB (ref 65–99)
GLUCOSE-CAPILLARY: 111 mg/dL — AB (ref 65–99)
GLUCOSE-CAPILLARY: 104 mg/dL — AB (ref 65–99)
Glucose-Capillary: 100 mg/dL — ABNORMAL HIGH (ref 65–99)
Glucose-Capillary: 111 mg/dL — ABNORMAL HIGH (ref 65–99)
Glucose-Capillary: 111 mg/dL — ABNORMAL HIGH (ref 65–99)
Glucose-Capillary: 98 mg/dL (ref 65–99)

## 2016-10-23 LAB — BLOOD GAS, ARTERIAL
ACID-BASE EXCESS: 2.4 mmol/L — AB (ref 0.0–2.0)
BICARBONATE: 25.7 mmol/L (ref 20.0–28.0)
Drawn by: 245131
FIO2: 28
O2 Saturation: 99.4 %
PCO2 ART: 34.7 mmHg (ref 32.0–48.0)
PO2 ART: 164 mmHg — AB (ref 83.0–108.0)
Patient temperature: 98.6
pH, Arterial: 7.481 — ABNORMAL HIGH (ref 7.350–7.450)

## 2016-10-23 LAB — CBC
HCT: 32.9 % — ABNORMAL LOW (ref 39.0–52.0)
Hemoglobin: 10.7 g/dL — ABNORMAL LOW (ref 13.0–17.0)
MCH: 23.4 pg — AB (ref 26.0–34.0)
MCHC: 32.5 g/dL (ref 30.0–36.0)
MCV: 72 fL — ABNORMAL LOW (ref 78.0–100.0)
PLATELETS: 371 10*3/uL (ref 150–400)
RBC: 4.57 MIL/uL (ref 4.22–5.81)
RDW: 14.6 % (ref 11.5–15.5)
WBC: 11.9 10*3/uL — ABNORMAL HIGH (ref 4.0–10.5)

## 2016-10-23 LAB — COMPREHENSIVE METABOLIC PANEL
ALT: 42 U/L (ref 17–63)
ANION GAP: 7 (ref 5–15)
AST: 21 U/L (ref 15–41)
Albumin: 2 g/dL — ABNORMAL LOW (ref 3.5–5.0)
Alkaline Phosphatase: 45 U/L (ref 38–126)
BILIRUBIN TOTAL: 0.5 mg/dL (ref 0.3–1.2)
BUN: 7 mg/dL (ref 6–20)
CO2: 27 mmol/L (ref 22–32)
Calcium: 8.1 mg/dL — ABNORMAL LOW (ref 8.9–10.3)
Chloride: 100 mmol/L — ABNORMAL LOW (ref 101–111)
Creatinine, Ser: 0.64 mg/dL (ref 0.61–1.24)
GFR calc Af Amer: >60 mL/min (ref >60)
Glucose, Bld: 106 mg/dL — ABNORMAL HIGH (ref 65–99)
POTASSIUM: 4.2 mmol/L (ref 3.5–5.1)
Sodium: 134 mmol/L — ABNORMAL LOW (ref 135–145)
TOTAL PROTEIN: 5.7 g/dL — AB (ref 6.5–8.1)

## 2016-10-23 LAB — CULTURE, RESPIRATORY

## 2016-10-23 LAB — CULTURE, RESPIRATORY W GRAM STAIN: Culture: NORMAL

## 2016-10-23 LAB — MRSA PCR SCREENING: MRSA by PCR: NEGATIVE

## 2016-10-23 MED ORDER — LISINOPRIL 20 MG PO TABS
40.0000 mg | ORAL_TABLET | Freq: Every day | ORAL | Status: DC
  Administered 2016-10-23 – 2016-10-26 (×4): 40 mg via ORAL
  Filled 2016-10-23 (×4): qty 2

## 2016-10-23 MED ORDER — METFORMIN HCL 500 MG PO TABS
500.0000 mg | ORAL_TABLET | Freq: Two times a day (BID) | ORAL | Status: DC
  Administered 2016-10-23 – 2016-10-26 (×7): 500 mg via ORAL
  Filled 2016-10-23 (×7): qty 1

## 2016-10-23 NOTE — Progress Notes (Addendum)
[address]       [place] [address]             [phone]      1 Day Post-Op Procedure(s) (LRB): VIDEO ASSISTED THORACOSCOPY (VATS)/THOROCOTOMY for Empyema  (Right) Subjective: Breathing is much more comfortable   Objective: Vital signs in last 24 hours: Temp:  [97.3 F (36.3 C)-98.3 F (36.8 C)] 97.9 F (36.6 C) (04/21 0427) Pulse Rate:  [58-85] 58 (04/21 0427) Cardiac Rhythm: Normal sinus rhythm;Sinus bradycardia (04/21 0427) Resp:  [14-28] 17 (04/21 0427) BP: (96-168)/(62-99) 155/72 (04/21 0427) SpO2:  [92 %-100 %] 97 % (04/21 0427) Arterial Line BP: (122-175)/(62-81) 175/79 (04/20 1700) Weight:  [244 lb 7.8 oz (110.9 kg)] 244 lb 7.8 oz (110.9 kg) (04/20 1515)  Hemodynamic parameters for last 24 hours:    Intake/Output from previous day: 04/20 0701 - 04/21 0700 In: 2186.7 [P.O.:240; I.V.:1446.7; IV Piggyback:500] Out: 2440 [Urine:2100; Chest Tube:340] Intake/Output this shift: No intake/output data recorded.  General appearance: alert, cooperative and no distress Heart: regular rate and rhythm Lungs: slightly coarse in upper airways Abdomen: benign Extremities: no calf tenderness or edema Wound: incis healing well  Lab Results:  Recent Labs  10/23/16 0505  WBC 11.9*  HGB 10.7*  HCT 32.9*  PLT 371   BMET:  Recent Labs  10/23/16 0505  NA 134*  K 4.2  CL 100*  CO2 27  GLUCOSE 106*  BUN 7  CREATININE 0.64  CALCIUM 8.1*    PT/INR: No results for input(s): LABPROT, INR in the last 72 hours. ABG    Component Value Date/Time   PHART 7.481 (H) 10/23/2016 0450   HCO3 25.7 10/23/2016 0450   O2SAT 99.4 10/23/2016 0450   CBG (last 3)   Recent Labs  10/22/16 2053 10/23/16 0104 10/23/16 0425  GLUCAP 128* 143* 111*    Meds Scheduled Meds: . acetaminophen  1,000 mg Oral Q6H   Or  . acetaminophen (TYLENOL) oral liquid 160 mg/5 mL  1,000 mg Oral Q6H  . bisacodyl  10 mg Oral Daily  . enoxaparin (LOVENOX) injection   40 mg Subcutaneous Daily  . fentaNYL   Intravenous Q4H  . insulin aspart  0-24 Units Subcutaneous Q4H  . ipratropium-albuterol  3 mL Nebulization QID  . nicotine  7 mg Transdermal Daily  . senna-docusate  1 tablet Oral QHS   Continuous Infusions: . azithromycin Stopped (10/22/16 2009)  . cefTRIAXone (ROCEPHIN)  IV Stopped (10/21/16 1501)  . dextrose 5 % and 0.9 % NaCl with KCl 20 mEq/L 100 mL/hr at 10/23/16 0315  . potassium chloride (KCL MULTIRUN) 30 mEq in 265 mL IVPB     PRN Meds:.diphenhydrAMINE **OR** diphenhydrAMINE, naloxone **AND** sodium chloride flush, ondansetron (ZOFRAN) IV, oxyCODONE, potassium chloride (KCL MULTIRUN) 30 mEq in 265 mL IVPB, traMADol  Xrays Dg Chest Port 1 View  Result Date: 10/22/2016 CLINICAL DATA:  Post op central line Lt chest tube. Empyema EXAM: PORTABLE CHEST 1 VIEW COMPARISON:  CT and chest radiograph, 10/20/2016 FINDINGS: Since prior studies, 2 right-sided chest tubes have been inserted. Both have their tips at the right apex. The majority of the right pleural fluid has been evacuated. There is residual discoid and linear opacity at the right lung base consistent with atelectasis. Remainder the right lung is clear. No right pneumothorax. Mild linear left lung base opacity is noted, stable, consistent with atelectasis. Left lung otherwise clear. No left pleural effusion. New right internal jugular central venous line has  its tip in the lower superior vena cava. IMPRESSION: 1. Right internal jugular central venous line tip projects in the lower superior vena cava approximately 3 cm above the caval atrial junction. 2. Two right chest tubes have their tips at the right apex. No pneumothorax. There has been significant improvement in right lung aeration with near complete evacuation of the right pleural fluid and significant improvement in right lung base atelectasis. Electronically Signed   By: Amie Portland M.D.   On: 10/22/2016 10:26    Assessment/Plan: S/P  Procedure(s) (LRB): VIDEO ASSISTED THORACOSCOPY (VATS)/THOROCOTOMY for Empyema  (Right)   1 good progress 2 CXR no pntx, improved aeration/effus 3 hemodyn stable in sinus rhythm, brady, hold on restart beta blocker for now- BP quite variable with syst 90's- 160's, restart lisinopril 4 sugars adeq controlled- restart metformin 5 d/c foley 6 260 cc fluid out yesterday , 70 so far today, no air leak - keep tubes for now 7 good UO, reduce IVF rate 8 cont current abx, leukocytosis improving 9 ABL- H/H ok- monitor  LOS: 4 days    GOLD,WAYNE E 10/23/2016  cxr much improved No culture data yet Cont current antibiotics, chest tubes patient examined and medical record reviewed,agree with above note. Kathlee Nations Trigt III 10/23/2016

## 2016-10-23 NOTE — Progress Notes (Signed)
3mL Fentanyl PCA wasted in sink, [employer] RN witness to [employer]

## 2016-10-23 NOTE — Plan of Care (Signed)
Problem: Skin Integrity: Goal: Risk for impaired skin integrity will decrease Outcome: Progressing Pt is turning adequately on his on, and is ambulating in the hall

## 2016-10-23 NOTE — Progress Notes (Signed)
Patient ID: Gerald Wheeler, male   DOB: 09-Jan-1961, 56 y.o.   MRN: [id number]                                                                PROGRESS NOTE                                                                                                                                                                                                             Patient Demographics:    Gerald Wheeler, is a 56 y.o. male, DOB - May 20, 1961, [id number]  Admit date - 10/19/2016   Admitting Physician Alba Cory, MD  Outpatient Primary MD for the patient is HAYES, Natale Lay, FNP  LOS - 4  Outpatient Specialists:   Chief Complaint  Patient presents with  . Cough       Brief Narrative 56 year old male past history of hypertension and diabetes seen in the emergency room on 4/17 for progressively worsening cough vitamin going now on for several weeks. Patient started experiencing dyspnea on exertion which continued to worsen with no relief despite cough suppressant. In the emergency room, found to have a community acquired pneumonia plus large right-sided pleural effusion.  This morning, patient white blood cell count increased from 11-13. No fever. Taken for thoracentesis and 0.5 L of fluid removed noting almost 16,000 white blood cells with 61% neutrophils. Gram stain-no organisms seen. Serum LDH at 930.  With potential empyema and certainly exudative effusion, patient sent for CT scan of chest which noted some mild areas of loculation. Discussed with cardiothoracic surgery who after reviewing CTS are recommended VATS procedure 4/20 .      Subjective:    Gerald Wheeler today has been doing well. Minimal cough.  Afebrile overnite.   No headache, No chest pain, No abdominal pain - No Nausea, No new weakness tingling or numbness, No SOB.    Assessment  & Plan :    Principal Problem:   Right lower lobe pneumonia (HCC) Active Problems:   Essential hypertension   PNA (pneumonia)  Community acquired pneumonia   Hyperlipidemia   Tobacco abuse   Diabetes mellitus type 2, noninsulin dependent (HCC)   Leukocytosis   Hyponatremia   Microcytic anemia   Acute respiratory failure with hypoxia (HCC)   Pleural effusion on  right   Empyema (HCC)   Community-acquired Right lower lobe pneumonia (HCC) complicated by right sided pleural effusion: Status post diagnostic thoracentesis. Discussed with pulmonary. While Gram stain negative, significant white count and elevated LDH certainly concerning for exudative effusion/potential empyema. CT scan notes areas of loculation, although not a lot of fluid. After reviewing CT, cardiothoracic surgery has performed VATS  4/20 Now has a chest tube Appreciate Alleen Borne, MD, assistance      Essential hypertension: Blood pressure soft after surgery. Discontinued lisinopril and Norvasc, continue metoprolol    Tobacco abuse: Nicotine patch    Diabetes mellitus type 2, noninsulin dependent (HCC): Sliding scale. CBGs stable    Leukocytosis, likely secondary to #1, will follow    Hyponatremia: Secondary dehydration.  sodium 134 on 4/18    Microcytic anemia: Preop hemoglobin 12, baseline 12.7, will recheck tomorrow    Acute respiratory failure with hypoxia (HCC): Secondary to pneumonia. Oxygen support, currently on 2 L    Code Status: Full code   Family Communication: Multiple family members at the bedside   Disposition Plan:   Anticipate  patient will be here for another 3-4 days   Consultants:  Discussed with pulmonary   Interventional radiology  Cardiothoracic surgery  Procedures:  Status post thoracentesis done 4/18   Right video assisted thoracoscopy 4/20  Antimicrobials:  IV Rocephin and Zithromax 4/16-present    DVT prophylaxis:  Lovenox    Lab Results  Component Value Date   PLT 371 10/23/2016      Anti-infectives    Start     Dose/Rate Route Frequency Ordered Stop    10/20/16 1400  cefTRIAXone (ROCEPHIN) 1 g in dextrose 5 % 50 mL IVPB     1 g 100 mL/hr over 30 Minutes Intravenous Every 24 hours 10/19/16 1833 10/27/16 1359   10/20/16 1400  azithromycin (ZITHROMAX) 500 mg in dextrose 5 % 250 mL IVPB     500 mg 250 mL/hr over 60 Minutes Intravenous Every 24 hours 10/19/16 1833 10/27/16 1759   10/19/16 1345  cefTRIAXone (ROCEPHIN) 1 g in dextrose 5 % 50 mL IVPB     1 g 100 mL/hr over 30 Minutes Intravenous  Once 10/19/16 1338 10/19/16 1412   10/19/16 1345  azithromycin (ZITHROMAX) 500 mg in dextrose 5 % 250 mL IVPB     500 mg 250 mL/hr over 60 Minutes Intravenous  Once 10/19/16 1338 10/19/16 1539   10/19/16 1345  azithromycin (ZITHROMAX) 500 MG injection    Comments:  Lucendia Herrlich   : cabinet override      10/19/16 1345 10/20/16 0159        Objective:   Vitals:   10/23/16 0100 10/23/16 0105 10/23/16 0400 10/23/16 0427  BP:  112/62  (!) 155/72  Pulse:  61  (!) 58  Resp: Temp:  97.6 F (36.4 C)  97.9 F (36.6 C)  TempSrc:  Oral  Oral  SpO2: 97% 100% 98% 97%  Weight:      Height:        Wt Readings from Last 3 Encounters:  10/22/16 110.9 kg (244 lb 7.8 oz)  02/27/16 107 kg (236 lb)  01/25/16 104.3 kg (230 lb)     Intake/Output Summary (Last 24 hours) at 10/23/16 0845 Last data filed at 10/23/16 0428  Gross per 24 hour  Intake          2186.67 ml  Output  2440 ml  Net          -253.33 ml     Physical Exam  Awake Alert, Oriented X 3, No new F.N deficits, Normal affect [place].AT,PERRAL Supple Neck,No JVD, No cervical lymphadenopathy appriciated.  Symmetrical Chest wall movement, Good air movement bilaterally, slight decreased bs at right lung base, no crackle, no wheeze RRR,No Gallops,Rubs or new Murmurs, No Parasternal Heave +ve B.Sounds, Abd Soft, No tenderness, No organomegaly appriciated, No rebound - guarding or rigidity. No Cyanosis, Clubbing or edema, No new Rash or bruise   Chest tube Foley in  place    Data Review:    CBC  Recent Labs Lab 10/19/16 1258 10/20/16 0623 10/23/16 0505  WBC 11.4* 13.3* 11.9*  HGB 12.9* 12.0* 10.7*  HCT 38.6* 36.9* 32.9*  PLT 346 342 371  MCV 71.5* 72.9* 72.0*  MCH 23.9* 23.7* 23.4*  MCHC 33.4 32.5 32.5  RDW 15.0 15.1 14.6  LYMPHSABS  --  0.7  --   MONOABS  --  0.7  --   EOSABS  --  0.0  --   BASOSABS  --  0.0  --     Chemistries   Recent Labs Lab 10/19/16 1258 10/20/16 0623 10/23/16 0505  NA 132* 134* 134*  K 4.5 3.8 4.2  CL 99* 102 100*  CO2 23 21* 27  GLUCOSE 118* 124* 106*  BUN CREATININE 0.79 0.69 0.64  CALCIUM 9.2 8.8* 8.1*  MG  --  1.9  --   AST 24 13* 21  ALT 20 14* 42  ALKPHOS 59 54 45  BILITOT 0.4 0.6 0.5   ------------------------------------------------------------------------------------------------------------------ No results for input(s): CHOL, HDL, LDLCALC, TRIG, CHOLHDL, LDLDIRECT in the last 72 hours.  No results found for: HGBA1C ------------------------------------------------------------------------------------------------------------------ No results for input(s): TSH, T4TOTAL, T3FREE, THYROIDAB in the last 72 hours.  Invalid input(s): FREET3 ------------------------------------------------------------------------------------------------------------------ No results for input(s): VITAMINB12, FOLATE, FERRITIN, TIBC, IRON, RETICCTPCT in the last 72 hours.  Coagulation profile No results for input(s): INR, PROTIME in the last 168 hours.  No results for input(s): DDIMER in the last 72 hours.  Cardiac Enzymes  Recent Labs Lab 10/19/16 1258  TROPONINI <0.03   ------------------------------------------------------------------------------------------------------------------    Component Value Date/Time   BNP 12.8 01/25/2016 1805    Inpatient Medications  Scheduled Meds: . acetaminophen  1,000 mg Oral Q6H   Or  . acetaminophen (TYLENOL) oral liquid 160 mg/5 mL  1,000 mg Oral  Q6H  . bisacodyl  10 mg Oral Daily  . enoxaparin (LOVENOX) injection  40 mg Subcutaneous Daily  . fentaNYL   Intravenous Q4H  . insulin aspart  0-24 Units Subcutaneous Q4H  . ipratropium-albuterol  3 mL Nebulization QID  . lisinopril  40 mg Oral Daily  . metFORMIN  500 mg Oral BID WC  . nicotine  7 mg Transdermal Daily  . senna-docusate  1 tablet Oral QHS   Continuous Infusions: . azithromycin Stopped (10/22/16 2009)  . cefTRIAXone (ROCEPHIN)  IV Stopped (10/21/16 1501)  . dextrose 5 % and 0.9 % NaCl with KCl 20 mEq/L 100 mL/hr at 10/23/16 0315  . potassium chloride (KCL MULTIRUN) 30 mEq in 265 mL IVPB     PRN Meds:.diphenhydrAMINE **OR** diphenhydrAMINE, naloxone **AND** sodium chloride flush, ondansetron (ZOFRAN) IV, oxyCODONE, potassium chloride (KCL MULTIRUN) 30 mEq in 265 mL IVPB, traMADol  Micro Results Recent Results (from the past 240 hour(s))  Culture, blood (routine x 2) Call MD if unable to obtain prior to antibiotics  being given     Status: None (Preliminary result)   Collection Time: 10/19/16  7:42 PM  Result Value Ref Range Status   Specimen Description BLOOD BLOOD LEFT ARM  Final   Special Requests   Final    BOTTLES DRAWN AEROBIC ONLY Blood Culture adequate volume   Culture   Final    NO GROWTH 2 DAYS Performed at [facility], [address] [place], [place] [address]    Report Status PENDING  Incomplete  Culture, blood (routine x 2) Call MD if unable to obtain prior to antibiotics being given     Status: None (Preliminary result)   Collection Time: 10/19/16  7:45 PM  Result Value Ref Range Status   Specimen Description BLOOD BLOOD LEFT ARM  Final   Special Requests   Final    BOTTLES DRAWN AEROBIC ONLY Blood Culture adequate volume   Culture   Final    NO GROWTH 2 DAYS Performed at [facility], [address], [place], [place] [address]    Report Status PENDING  Incomplete  Respiratory Panel by PCR     Status: None   Collection Time:  10/20/16  7:34 AM  Result Value Ref Range Status   Adenovirus NOT DETECTED NOT DETECTED Final   Coronavirus 229E NOT DETECTED NOT DETECTED Final   Coronavirus HKU1 NOT DETECTED NOT DETECTED Final   Coronavirus NL63 NOT DETECTED NOT DETECTED Final   Coronavirus OC43 NOT DETECTED NOT DETECTED Final   Metapneumovirus NOT DETECTED NOT DETECTED Final   Rhinovirus / Enterovirus NOT DETECTED NOT DETECTED Final   Influenza A NOT DETECTED NOT DETECTED Final   Influenza B NOT DETECTED NOT DETECTED Final   Parainfluenza Virus 1 NOT DETECTED NOT DETECTED Final   Parainfluenza Virus 2 NOT DETECTED NOT DETECTED Final   Parainfluenza Virus 3 NOT DETECTED NOT DETECTED Final   Parainfluenza Virus 4 NOT DETECTED NOT DETECTED Final   Respiratory Syncytial Virus NOT DETECTED NOT DETECTED Final   Bordetella pertussis NOT DETECTED NOT DETECTED Final   Chlamydophila pneumoniae NOT DETECTED NOT DETECTED Final   Mycoplasma pneumoniae NOT DETECTED NOT DETECTED Final    Comment: Performed at [facility], [address] [place], [place] [address]  Culture, body fluid-bottle     Status: None (Preliminary result)   Collection Time: 10/20/16  1:01 PM  Result Value Ref Range Status   Specimen Description FLUID RIGHT PLEURAL  Final   Special Requests NONE  Final   Culture   Final    NO GROWTH < 24 HOURS Performed at [facility], [address] [place], [place] [address]    Report Status PENDING  Incomplete  Gram stain     Status: None   Collection Time: 10/20/16  1:01 PM  Result Value Ref Range Status   Specimen Description FLUID RIGHT PLEURAL  Final   Special Requests NONE  Final   Gram Stain   Final    ABUNDANT WBC PRESENT,BOTH PMN AND MONONUCLEAR NO ORGANISMS SEEN Performed at [facility], [address], [place], [place] [address]    Report Status 10/20/2016 FINAL  Final  Culture, sputum-assessment     Status: None   Collection Time: 10/20/16  4:09 PM  Result Value Ref Range  Status   Specimen Description SPUTUM  Final   Special Requests NONE  Final   Sputum evaluation THIS SPECIMEN IS ACCEPTABLE FOR SPUTUM CULTURE  Final   Report Status 10/20/2016  FINAL  Final  Culture, respiratory (NON-Expectorated)     Status: None (Preliminary result)   Collection Time: 10/20/16  4:09 PM  Result Value Ref Range Status   Specimen Description SPUTUM  Final   Special Requests NONE Reflexed from [id number]  Final   Gram Stain   Final    RARE WBC PRESENT, PREDOMINANTLY PMN RARE GRAM POSITIVE COCCI IN PAIRS RARE GRAM POSITIVE RODS    Culture   Final    CULTURE REINCUBATED FOR BETTER GROWTH Performed at [facility], [address], [place], [place] [address]    Report Status PENDING  Incomplete  Gram stain     Status: None   Collection Time: 10/22/16  8:36 AM  Result Value Ref Range Status   Specimen Description PLEURAL RIGHT  Final   Special Requests NONE  Final   Gram Stain   Final    MODERATE WBC PRESENT,BOTH PMN AND MONONUCLEAR NO ORGANISMS SEEN    Report Status 10/22/2016 FINAL  Final  Aerobic Culture (superficial specimen)     Status: None (Preliminary result)   Collection Time: 10/22/16  8:55 AM  Result Value Ref Range Status   Specimen Description TISSUE RIGHT PLEURAL  Final   Special Requests PEEL  Final   Gram Stain   Final    MODERATE WBC PRESENT, PREDOMINANTLY PMN NO ORGANISMS SEEN    Culture PENDING  Incomplete   Report Status PENDING  Incomplete  MRSA PCR Screening     Status: None   Collection Time: 10/23/16  2:16 AM  Result Value Ref Range Status   MRSA by PCR NEGATIVE NEGATIVE Final    Comment:        The GeneXpert MRSA Assay (FDA approved for NASAL specimens only), is one component of a comprehensive MRSA colonization surveillance program. It is not intended to diagnose MRSA infection nor to guide or monitor treatment for MRSA infections.     Radiology Reports Dg Chest 2 View  Result Date: 10/20/2016 CLINICAL DATA:  Post  right thoracentesis EXAM: CHEST  2 VIEW COMPARISON:  10/19/2016 FINDINGS: Moderate right pleural effusion post thoracentesis is unchanged from yesterday. No pneumothorax. Right lower lobe atelectasis/ infiltrate unchanged. Mild left lower lobe atelectasis.  Negative for heart failure IMPRESSION: Negative for pneumothorax post right thoracentesis. No change in moderate right pleural effusion and right lower lobe airspace disease. Electronically Signed   By: Marlan Palau M.D.   On: 10/20/2016 10:52   Dg Chest 2 View  Result Date: 10/19/2016 CLINICAL DATA:  Cough, congestion, shortness of breath EXAM: CHEST  2 VIEW COMPARISON:  01/25/2016 FINDINGS: New small to moderate right pleural effusion with right lower lung collapse/consolidation. Appearance is compatible with right basilar pneumonia and parapneumonic effusion. Minor left base atelectasis. Upper lobes remain clear. Normal heart size and vascularity. Trachea is midline. Normal bowel gas pattern. Minor thoracic spondylosis. IMPRESSION: Right lower lung consolidation and small to moderate right pleural effusion compatible with pneumonia, as above. Recommend radiographic follow-up to document resolution. Electronically Signed   By: Judie Petit.  Shick M.D.   On: 10/19/2016 13:28   Ct Chest W Contrast  Result Date: 10/20/2016 CLINICAL DATA:  Pneumonia.  Effusion.  Thoracentesis. EXAM: CT CHEST WITH CONTRAST TECHNIQUE: Multidetector CT imaging of the chest was performed during intravenous contrast administration. CONTRAST:  75mL ISOVUE-300 IOPAMIDOL (ISOVUE-300) INJECTION 61% COMPARISON:  Radiography same day.  Radiography 10/19/2016 FINDINGS: Cardiovascular: Aortic atherosclerosis. No aneurysm or dissection. Extensive coronary artery calcification. Small amount of pericardial fluid. Pulmonary arteries  appear unremarkable. Mediastinum/Nodes: No mediastinal mass or lymphadenopathy. Lungs/Pleura: There is mild atelectasis at the left lung base. The left lung is  otherwise clear. On the right, there is pleural fluid which largely layers dependently but shows some loculation. There is complete collapse of the right lower lobe and near complete collapse of the right middle lobe. Right upper lobe remains largely inflated. Both lungs show minimal areas of emphysema at the apices. Upper Abdomen: Negative Musculoskeletal: Normal IMPRESSION: Right pleural effusion, largely dependent but with some areas of loculation. Complete collapse of the right lower lobe and right middle lobe. Right upper lobe almost completely inflated. Dependent atelectasis at the left lung base. No pleural effusion seen on the left. Aortic atherosclerosis. Extensive coronary artery calcification. Small amount of pericardial fluid. Electronically Signed   By: Paulina Fusi M.D.   On: 10/20/2016 21:31   Dg Chest Port 1 View  Result Date: 10/23/2016 CLINICAL DATA:  Patient with history of empyema. Right chest tube in place. EXAM: PORTABLE CHEST 1 VIEW COMPARISON:  Chest radiograph 10/22/2016 FINDINGS: Monitoring leads overlie the patient. Right IJ central venous catheter tip projects over the superior cavoatrial junction. Two right-sided chest tubes remain in place. Stable cardiac and mediastinal contours. Low lung volumes. Right-greater-than-left basilar heterogeneous opacities. Persistent right pleural fluid/ thickening. Small left pleural fluid/ thickening. New small right pneumothorax. IMPRESSION: New small right pneumothorax.  Right chest tubes remain in position. Stable support apparatus. Persistent right pleural thickening/ fluid with associated heterogeneous opacities at the lung base which may represent atelectasis or infection. Critical Value/emergent results were called by telephone at the time of interpretation on 10/23/2016 at 8:13 am to [facility], who verbally acknowledged these results. Electronically Signed   By: Annia Belt M.D.   On: 10/23/2016 08:14   Dg Chest Port 1 View  Result  Date: 10/22/2016 CLINICAL DATA:  Post op central line Lt chest tube. Empyema EXAM: PORTABLE CHEST 1 VIEW COMPARISON:  CT and chest radiograph, 10/20/2016 FINDINGS: Since prior studies, 2 right-sided chest tubes have been inserted. Both have their tips at the right apex. The majority of the right pleural fluid has been evacuated. There is residual discoid and linear opacity at the right lung base consistent with atelectasis. Remainder the right lung is clear. No right pneumothorax. Mild linear left lung base opacity is noted, stable, consistent with atelectasis. Left lung otherwise clear. No left pleural effusion. New right internal jugular central venous line has its tip in the lower superior vena cava. IMPRESSION: 1. Right internal jugular central venous line tip projects in the lower superior vena cava approximately 3 cm above the caval atrial junction. 2. Two right chest tubes have their tips at the right apex. No pneumothorax. There has been significant improvement in right lung aeration with near complete evacuation of the right pleural fluid and significant improvement in right lung base atelectasis. Electronically Signed   By: Amie Portland M.D.   On: 10/22/2016 10:26   US Thoracentesis Asp Pleural Space W/img Guide  Result Date: 10/20/2016 INDICATION: Respiratory failure secondary to community acquired right lower lobe pneumonia with pleural effusion. Request is made for diagnostic and therapeutic thoracentesis. EXAM: ULTRASOUND GUIDED DIAGNOSTIC AND THERAPEUTIC THORACENTESIS MEDICATIONS: 1% lidocaine COMPLICATIONS: None immediate. PROCEDURE: An ultrasound guided thoracentesis was thoroughly discussed with the patient and questions answered. The benefits, risks, alternatives and complications were also discussed. The patient understands and wishes to proceed with the procedure. Written consent was obtained. Ultrasound was performed to localize and mark an  adequate pocket of fluid in the right chest. The  area was then prepped and draped in the normal sterile fashion. 1% Lidocaine was used for local anesthesia. Under ultrasound guidance a Safe-T-Centesis catheter was introduced. Thoracentesis was performed. The catheter was removed and a dressing applied. FINDINGS: A total of approximately 0.5 L of slightly cloudy yellow fluid was removed. There did appear to be a small loculation noted within this very small amount of fluid. Samples were sent to the laboratory as requested by the clinical team. IMPRESSION: Successful ultrasound guided right thoracentesis yielding 0.5 L of pleural fluid. Read by: Barnetta Chapel, PA-C Electronically Signed   By: Gilmer Mor D.O.   On: 10/20/2016 13:52    Time Spent in minutes  30   Pearson Grippe M.D on 10/23/2016 at 8:45 AM  Between 7am to 7pm - Pager - [phone]  After 7pm go to [email or web address] - password [facility]  [facility] -  Office  [phone]

## 2016-10-24 ENCOUNTER — Inpatient Hospital Stay (HOSPITAL_COMMUNITY): Payer: Self-pay

## 2016-10-24 DIAGNOSIS — J869 Pyothorax without fistula: Secondary | ICD-10-CM

## 2016-10-24 LAB — COMPREHENSIVE METABOLIC PANEL
ALT: 34 U/L (ref 17–63)
AST: 17 U/L (ref 15–41)
Albumin: 2 g/dL — ABNORMAL LOW (ref 3.5–5.0)
Alkaline Phosphatase: 49 U/L (ref 38–126)
Anion gap: 7 (ref 5–15)
BILIRUBIN TOTAL: 0.4 mg/dL (ref 0.3–1.2)
BUN: 6 mg/dL (ref 6–20)
CHLORIDE: 97 mmol/L — AB (ref 101–111)
CO2: 28 mmol/L (ref 22–32)
CREATININE: 0.67 mg/dL (ref 0.61–1.24)
Calcium: 8.3 mg/dL — ABNORMAL LOW (ref 8.9–10.3)
Glucose, Bld: 97 mg/dL (ref 65–99)
POTASSIUM: 3.8 mmol/L (ref 3.5–5.1)
Sodium: 132 mmol/L — ABNORMAL LOW (ref 135–145)
TOTAL PROTEIN: 6 g/dL — AB (ref 6.5–8.1)

## 2016-10-24 LAB — CULTURE, BLOOD (ROUTINE X 2)
CULTURE: NO GROWTH
CULTURE: NO GROWTH
SPECIAL REQUESTS: ADEQUATE
Special Requests: ADEQUATE

## 2016-10-24 LAB — GLUCOSE, CAPILLARY
GLUCOSE-CAPILLARY: 95 mg/dL (ref 65–99)
GLUCOSE-CAPILLARY: 138 mg/dL — AB (ref 65–99)
GLUCOSE-CAPILLARY: 125 mg/dL — AB (ref 65–99)
Glucose-Capillary: 125 mg/dL — ABNORMAL HIGH (ref 65–99)
Glucose-Capillary: 100 mg/dL — ABNORMAL HIGH (ref 65–99)
Glucose-Capillary: 104 mg/dL — ABNORMAL HIGH (ref 65–99)

## 2016-10-24 LAB — CBC
HEMATOCRIT: 35.1 % — AB (ref 39.0–52.0)
Hemoglobin: 11.6 g/dL — ABNORMAL LOW (ref 13.0–17.0)
MCH: 23.6 pg — AB (ref 26.0–34.0)
MCHC: 33 g/dL (ref 30.0–36.0)
MCV: 71.5 fL — AB (ref 78.0–100.0)
PLATELETS: 389 10*3/uL (ref 150–400)
RBC: 4.91 MIL/uL (ref 4.22–5.81)
RDW: 14.4 % (ref 11.5–15.5)
WBC: 9.5 10*3/uL (ref 4.0–10.5)

## 2016-10-24 MED ORDER — AZITHROMYCIN 500 MG PO TABS
500.0000 mg | ORAL_TABLET | ORAL | Status: DC
  Administered 2016-10-24 – 2016-10-25 (×2): 500 mg via ORAL
  Filled 2016-10-24 (×2): qty 1

## 2016-10-24 MED ORDER — AMLODIPINE BESYLATE 5 MG PO TABS
5.0000 mg | ORAL_TABLET | Freq: Every day | ORAL | Status: DC
  Administered 2016-10-24 – 2016-10-26 (×3): 5 mg via ORAL
  Filled 2016-10-24 (×3): qty 1

## 2016-10-24 MED ORDER — ALUM & MAG HYDROXIDE-SIMETH 200-200-20 MG/5ML PO SUSP
30.0000 mL | Freq: Once | ORAL | Status: AC
  Administered 2016-10-24: 30 mL via ORAL
  Filled 2016-10-24: qty 30

## 2016-10-24 NOTE — Plan of Care (Signed)
Problem: Pain Managment: Goal: General experience of comfort will improve Outcome: Progressing Pain is controlled at this time

## 2016-10-24 NOTE — Progress Notes (Signed)
RT note-Patient now on 2l/min [place], states having some abdominal pain, RN notified.

## 2016-10-24 NOTE — Progress Notes (Signed)
PROGRESS NOTE                                                                                                                                                                                                             Patient Demographics:    Gerald Wheeler, is a 56 y.o. male, DOB - 09/09/1960, ZHY:[id number]  Admit date - 10/19/2016   Admitting Physician Alba Cory, MD  Outpatient Primary MD for the patient is HAYES, Natale Lay, FNP  LOS - 5  Outpatient Specialists:None  Chief Complaint  Patient presents with  . Cough       Brief Narrative   56 year old male with history of hypertension and diabetes resent it to the ED with progressive cough followed by dyspnea on exertion. In the ED he was found to have right lobar pneumonia with large pleural effusion. Patient underwent thoracenteses with 500 mL fluid removed showing significant WBC. A CT of the chest was done which showed 80 as of localization. Patient seen by CT VS and underwent VATS on 4/20.   Subjective:    Reports some irritation over chest tube site. Denies any dyspnea. Was unable to tolerate PCA due to ongoing hiccups.   Assessment  & Plan :    Principal Problem:   Right lower lobe pneumonia (HCC)   Empyema (HCC) Underwent VATS on 4/20. Chest tube in place. (CTV is following. Plan on removing 1-2 day. Cultures negative. Remains afebrile and leukocytosis resolved. Continue oxycodone and tramadol for pain. Continue Rocephin and azithromycin. Supportive care with Tylenol and antitussives. Follow-up with serial chest x-rays.  Active Problems:  Acute respiratory failure with hypoxia Secondary to pneumonia and empyema. Now improved.    Essential hypertension Stable. Continue amlodipine.    Tobacco abuse Counseled on cessation. Continue nicotine patch.    Diabetes mellitus type 2, noninsulin dependent (HCC) Continue metformin and SSI. CBG  stable.    Hyponatremia Secondary to pneumonia. Currently improved.      Code Status : Full code  Family Communication  : None at bedside  Disposition Plan  : Home once chest tube removed  Barriers For Discharge : Active symptoms  Consults  : CT VS  Procedures  :  CT chest Thoracentesis VATS on 4/20  DVT Prophylaxis  :  Lovenox -   Lab Results  Component Value Date  PLT 389 10/24/2016    Antibiotics  :    Anti-infectives    Start     Dose/Rate Route Frequency Ordered Stop   10/24/16 1800  azithromycin (ZITHROMAX) tablet 500 mg     500 mg Oral Every 24 hours 10/24/16 1131 10/27/16 1759   10/20/16 1400  cefTRIAXone (ROCEPHIN) 1 g in dextrose 5 % 50 mL IVPB     1 g 100 mL/hr over 30 Minutes Intravenous Every 24 hours 10/19/16 1833 10/27/16 1359   10/20/16 1400  azithromycin (ZITHROMAX) 500 mg in dextrose 5 % 250 mL IVPB  Status:  Discontinued     500 mg 250 mL/hr over 60 Minutes Intravenous Every 24 hours 10/19/16 1833 10/24/16 1131   10/19/16 1345  cefTRIAXone (ROCEPHIN) 1 g in dextrose 5 % 50 mL IVPB     1 g 100 mL/hr over 30 Minutes Intravenous  Once 10/19/16 1338 10/19/16 1412   10/19/16 1345  azithromycin (ZITHROMAX) 500 mg in dextrose 5 % 250 mL IVPB     500 mg 250 mL/hr over 60 Minutes Intravenous  Once 10/19/16 1338 10/19/16 1539   10/19/16 1345  azithromycin (ZITHROMAX) 500 MG injection    Comments:  Lucendia Herrlich   : cabinet override      10/19/16 1345 10/20/16 0159        Objective:   Vitals:   10/24/16 0718 10/24/16 0825 10/24/16 1100 10/24/16 1201  BP: 95/71     Pulse: 74     Resp: 19     Temp: 98.6 F (37 C)  98 F (36.7 C)   TempSrc: Oral  Oral   SpO2: 93% 96%  95%  Weight:      Height:        Wt Readings from Last 3 Encounters:  10/22/16 110.9 kg (244 lb 7.8 oz)  02/27/16 107 kg (236 lb)  01/25/16 104.3 kg (230 lb)     Intake/Output Summary (Last 24 hours) at 10/24/16 1400 Last data filed at 10/24/16 0945  Gross per 24  hour  Intake             1630 ml  Output             1780 ml  Net             -150 ml     Physical Exam  Gen: not in distress HEENT: moist mucosa, supple neck Chest: Diminished breath sounds over right lung, no crackles, right-sided chest tubes CVS: N S1&S2, no murmurs GI: soft, NT, ND, Musculoskeletal: warm, no edema     Data Review:    CBC  Recent Labs Lab 10/19/16 1258 10/20/16 0623 10/23/16 0505 10/24/16 0327  WBC 11.4* 13.3* 11.9* 9.5  HGB 12.9* 12.0* 10.7* 11.6*  HCT 38.6* 36.9* 32.9* 35.1*  PLT 346 342 371 389  MCV 71.5* 72.9* 72.0* 71.5*  MCH 23.9* 23.7* 23.4* 23.6*  MCHC 33.4 32.5 32.5 33.0  RDW 15.0 15.1 14.6 14.4  LYMPHSABS  --  0.7  --   --   MONOABS  --  0.7  --   --   EOSABS  --  0.0  --   --   BASOSABS  --  0.0  --   --     Chemistries   Recent Labs Lab 10/19/16 1258 10/20/16 0623 10/23/16 0505 10/24/16 0327  NA 132* 134* 134* 132*  K 4.5 3.8 4.2 3.8  CL 99* 102 100* 97*  CO2 23 21* 27 28  GLUCOSE 118* 124* 106* 97  BUN CREATININE 0.79 0.69 0.64 0.67  CALCIUM 9.2 8.8* 8.1* 8.3*  MG  --  1.9  --   --   AST 24 13* 21 17  ALT 20 14* 42 34  ALKPHOS 59 54 45 49  BILITOT 0.4 0.6 0.5 0.4   ------------------------------------------------------------------------------------------------------------------ No results for input(s): CHOL, HDL, LDLCALC, TRIG, CHOLHDL, LDLDIRECT in the last 72 hours.  No results found for: HGBA1C ------------------------------------------------------------------------------------------------------------------ No results for input(s): TSH, T4TOTAL, T3FREE, THYROIDAB in the last 72 hours.  Invalid input(s): FREET3 ------------------------------------------------------------------------------------------------------------------ No results for input(s): VITAMINB12, FOLATE, FERRITIN, TIBC, IRON, RETICCTPCT in the last 72 hours.  Coagulation profile No results for input(s): INR, PROTIME in the last 168  hours.  No results for input(s): DDIMER in the last 72 hours.  Cardiac Enzymes  Recent Labs Lab 10/19/16 1258  TROPONINI <0.03   ------------------------------------------------------------------------------------------------------------------    Component Value Date/Time   BNP 12.8 01/25/2016 1805    Inpatient Medications  Scheduled Meds: . acetaminophen  1,000 mg Oral Q6H   Or  . acetaminophen (TYLENOL) oral liquid 160 mg/5 mL  1,000 mg Oral Q6H  . amLODipine  5 mg Oral Daily  . azithromycin  500 mg Oral Q24H  . bisacodyl  10 mg Oral Daily  . enoxaparin (LOVENOX) injection  40 mg Subcutaneous Daily  . fentaNYL   Intravenous Q4H  . insulin aspart  0-24 Units Subcutaneous Q4H  . ipratropium-albuterol  3 mL Nebulization QID  . lisinopril  40 mg Oral Daily  . metFORMIN  500 mg Oral BID WC  . nicotine  7 mg Transdermal Daily  . senna-docusate  1 tablet Oral QHS   Continuous Infusions: . cefTRIAXone (ROCEPHIN)  IV Stopped (10/24/16 1336)  . dextrose 5 % and 0.9 % NaCl with KCl 20 mEq/L 10 mL/hr at 10/24/16 0917  . potassium chloride (KCL MULTIRUN) 30 mEq in 265 mL IVPB     PRN Meds:.diphenhydrAMINE **OR** diphenhydrAMINE, naloxone **AND** sodium chloride flush, ondansetron (ZOFRAN) IV, oxyCODONE, potassium chloride (KCL MULTIRUN) 30 mEq in 265 mL IVPB, traMADol  Micro Results Recent Results (from the past 240 hour(s))  Culture, blood (routine x 2) Call MD if unable to obtain prior to antibiotics being given     Status: None   Collection Time: 10/19/16  7:42 PM  Result Value Ref Range Status   Specimen Description BLOOD BLOOD LEFT ARM  Final   Special Requests   Final    BOTTLES DRAWN AEROBIC ONLY Blood Culture adequate volume   Culture   Final    NO GROWTH 5 DAYS Performed at [facility], [address] [place], [place] [address]    Report Status 10/24/2016 FINAL  Final  Culture, blood (routine x 2) Call MD if unable to obtain prior to antibiotics being  given     Status: None   Collection Time: 10/19/16  7:45 PM  Result Value Ref Range Status   Specimen Description BLOOD BLOOD LEFT ARM  Final   Special Requests   Final    BOTTLES DRAWN AEROBIC ONLY Blood Culture adequate volume   Culture   Final    NO GROWTH 5 DAYS Performed at [facility], [address], [place], [place] [address]    Report Status 10/24/2016 FINAL  Final  Respiratory Panel by PCR     Status: None   Collection Time: 10/20/16  7:34 AM  Result Value Ref Range Status  Adenovirus NOT DETECTED NOT DETECTED Final   Coronavirus 229E NOT DETECTED NOT DETECTED Final   Coronavirus HKU1 NOT DETECTED NOT DETECTED Final   Coronavirus NL63 NOT DETECTED NOT DETECTED Final   Coronavirus OC43 NOT DETECTED NOT DETECTED Final   Metapneumovirus NOT DETECTED NOT DETECTED Final   Rhinovirus / Enterovirus NOT DETECTED NOT DETECTED Final   Influenza A NOT DETECTED NOT DETECTED Final   Influenza B NOT DETECTED NOT DETECTED Final   Parainfluenza Virus 1 NOT DETECTED NOT DETECTED Final   Parainfluenza Virus 2 NOT DETECTED NOT DETECTED Final   Parainfluenza Virus 3 NOT DETECTED NOT DETECTED Final   Parainfluenza Virus 4 NOT DETECTED NOT DETECTED Final   Respiratory Syncytial Virus NOT DETECTED NOT DETECTED Final   Bordetella pertussis NOT DETECTED NOT DETECTED Final   Chlamydophila pneumoniae NOT DETECTED NOT DETECTED Final   Mycoplasma pneumoniae NOT DETECTED NOT DETECTED Final    Comment: Performed at [facility], [address], [place], [place] [address]  Culture, body fluid-bottle     Status: None (Preliminary result)   Collection Time: 10/20/16  1:01 PM  Result Value Ref Range Status   Specimen Description FLUID RIGHT PLEURAL  Final   Special Requests NONE  Final   Culture   Final    NO GROWTH 4 DAYS Performed at [facility], [address] [place], [place] [address]    Report Status PENDING  Incomplete  Gram stain     Status: None   Collection Time:  10/20/16  1:01 PM  Result Value Ref Range Status   Specimen Description FLUID RIGHT PLEURAL  Final   Special Requests NONE  Final   Gram Stain   Final    ABUNDANT WBC PRESENT,BOTH PMN AND MONONUCLEAR NO ORGANISMS SEEN Performed at [facility], [address] [place], [place] [address]    Report Status 10/20/2016 FINAL  Final  Culture, sputum-assessment     Status: None   Collection Time: 10/20/16  4:09 PM  Result Value Ref Range Status   Specimen Description SPUTUM  Final   Special Requests NONE  Final   Sputum evaluation THIS SPECIMEN IS ACCEPTABLE FOR SPUTUM CULTURE  Final   Report Status 10/20/2016 FINAL  Final  Culture, respiratory (NON-Expectorated)     Status: None   Collection Time: 10/20/16  4:09 PM  Result Value Ref Range Status   Specimen Description SPUTUM  Final   Special Requests NONE Reflexed from [id number]  Final   Gram Stain   Final    RARE WBC PRESENT, PREDOMINANTLY PMN RARE GRAM POSITIVE COCCI IN PAIRS RARE GRAM POSITIVE RODS    Culture   Final    Consistent with normal respiratory flora. Performed at [facility], [address] [place], [place] [address]    Report Status 10/23/2016 FINAL  Final  Culture, body fluid-bottle     Status: None (Preliminary result)   Collection Time: 10/22/16  8:36 AM  Result Value Ref Range Status   Specimen Description PLEURAL RIGHT  Final   Special Requests NONE  Final   Culture NO GROWTH 2 DAYS  Final   Report Status PENDING  Incomplete  Gram stain     Status: None   Collection Time: 10/22/16  8:36 AM  Result Value Ref Range Status   Specimen Description PLEURAL RIGHT  Final   Special Requests NONE  Final   Gram Stain   Final    MODERATE WBC PRESENT,BOTH PMN AND  MONONUCLEAR NO ORGANISMS SEEN    Report Status 10/22/2016 FINAL  Final  Anaerobic culture     Status: None (Preliminary result)   Collection Time: 10/22/16  8:55 AM  Result Value Ref Range Status   Specimen Description TISSUE RIGHT PLEURAL   Final   Special Requests PEEL  Final   Culture   Final    NO ANAEROBES ISOLATED; CULTURE IN PROGRESS FOR 5 DAYS   Report Status PENDING  Incomplete  Aerobic Culture (superficial specimen)     Status: None (Preliminary result)   Collection Time: 10/22/16  8:55 AM  Result Value Ref Range Status   Specimen Description TISSUE RIGHT PLEURAL  Final   Special Requests PEEL  Final   Gram Stain   Final    MODERATE WBC PRESENT, PREDOMINANTLY PMN NO ORGANISMS SEEN    Culture NO GROWTH 1 DAY  Final   Report Status PENDING  Incomplete  MRSA PCR Screening     Status: None   Collection Time: 10/23/16  2:16 AM  Result Value Ref Range Status   MRSA by PCR NEGATIVE NEGATIVE Final    Comment:        The GeneXpert MRSA Assay (FDA approved for NASAL specimens only), is one component of a comprehensive MRSA colonization surveillance program. It is not intended to diagnose MRSA infection nor to guide or monitor treatment for MRSA infections.     Radiology Reports Dg Chest 2 View  Result Date: 10/20/2016 CLINICAL DATA:  Post right thoracentesis EXAM: CHEST  2 VIEW COMPARISON:  10/19/2016 FINDINGS: Moderate right pleural effusion post thoracentesis is unchanged from yesterday. No pneumothorax. Right lower lobe atelectasis/ infiltrate unchanged. Mild left lower lobe atelectasis.  Negative for heart failure IMPRESSION: Negative for pneumothorax post right thoracentesis. No change in moderate right pleural effusion and right lower lobe airspace disease. Electronically Signed   By: Marlan Palau M.D.   On: 10/20/2016 10:52   Dg Chest 2 View  Result Date: 10/19/2016 CLINICAL DATA:  Cough, congestion, shortness of breath EXAM: CHEST  2 VIEW COMPARISON:  01/25/2016 FINDINGS: New small to moderate right pleural effusion with right lower lung collapse/consolidation. Appearance is compatible with right basilar pneumonia and parapneumonic effusion. Minor left base atelectasis. Upper lobes remain clear. Normal  heart size and vascularity. Trachea is midline. Normal bowel gas pattern. Minor thoracic spondylosis. IMPRESSION: Right lower lung consolidation and small to moderate right pleural effusion compatible with pneumonia, as above. Recommend radiographic follow-up to document resolution. Electronically Signed   By: Judie Petit.  Shick M.D.   On: 10/19/2016 13:28   Ct Chest W Contrast  Result Date: 10/20/2016 CLINICAL DATA:  Pneumonia.  Effusion.  Thoracentesis. EXAM: CT CHEST WITH CONTRAST TECHNIQUE: Multidetector CT imaging of the chest was performed during intravenous contrast administration. CONTRAST:  75mL ISOVUE-300 IOPAMIDOL (ISOVUE-300) INJECTION 61% COMPARISON:  Radiography same day.  Radiography 10/19/2016 FINDINGS: Cardiovascular: Aortic atherosclerosis. No aneurysm or dissection. Extensive coronary artery calcification. Small amount of pericardial fluid. Pulmonary arteries appear unremarkable. Mediastinum/Nodes: No mediastinal mass or lymphadenopathy. Lungs/Pleura: There is mild atelectasis at the left lung base. The left lung is otherwise clear. On the right, there is pleural fluid which largely layers dependently but shows some loculation. There is complete collapse of the right lower lobe and near complete collapse of the right middle lobe. Right upper lobe remains largely inflated. Both lungs show minimal areas of emphysema at the apices. Upper Abdomen: Negative Musculoskeletal: Normal IMPRESSION: Right pleural effusion, largely dependent but with some areas of loculation.  Complete collapse of the right lower lobe and right middle lobe. Right upper lobe almost completely inflated. Dependent atelectasis at the left lung base. No pleural effusion seen on the left. Aortic atherosclerosis. Extensive coronary artery calcification. Small amount of pericardial fluid. Electronically Signed   By: Paulina Fusi M.D.   On: 10/20/2016 21:31   Dg Chest Port 1 View  Result Date: 10/24/2016 CLINICAL DATA:  Patient with  right-sided chest tube. EXAM: PORTABLE CHEST 1 VIEW COMPARISON:  Chest radiograph 10/23/2016. FINDINGS: Right IJ central venous catheter tip projects over the superior cavoatrial junction. Two right chest tubes remain in place. Monitoring leads overlie the patient. Stable cardiac and mediastinal contours. Interval increase in size of moderate left pleural effusion with underlying heterogeneous opacities. Persistent small right pleural effusion with underlying heterogeneous opacities. Probable tiny right apical pneumothorax. IMPRESSION: Right chest tubes remain in place. Probable tiny right apical pneumothorax. Persistent small right pleural effusion and underlying opacities. Slight interval increase in size of moderate left pleural effusion and underlying heterogeneous opacities. Electronically Signed   By: Annia Belt M.D.   On: 10/24/2016 07:56   Dg Chest Port 1 View  Result Date: 10/23/2016 CLINICAL DATA:  Patient with history of empyema. Right chest tube in place. EXAM: PORTABLE CHEST 1 VIEW COMPARISON:  Chest radiograph 10/22/2016 FINDINGS: Monitoring leads overlie the patient. Right IJ central venous catheter tip projects over the superior cavoatrial junction. Two right-sided chest tubes remain in place. Stable cardiac and mediastinal contours. Low lung volumes. Right-greater-than-left basilar heterogeneous opacities. Persistent right pleural fluid/ thickening. Small left pleural fluid/ thickening. New small right pneumothorax. IMPRESSION: New small right pneumothorax.  Right chest tubes remain in position. Stable support apparatus. Persistent right pleural thickening/ fluid with associated heterogeneous opacities at the lung base which may represent atelectasis or infection. Critical Value/emergent results were called by telephone at the time of interpretation on 10/23/2016 at 8:13 am to [facility], who verbally acknowledged these results. Electronically Signed   By: Annia Belt M.D.   On: 10/23/2016  08:14   Dg Chest Port 1 View  Result Date: 10/22/2016 CLINICAL DATA:  Post op central line Lt chest tube. Empyema EXAM: PORTABLE CHEST 1 VIEW COMPARISON:  CT and chest radiograph, 10/20/2016 FINDINGS: Since prior studies, 2 right-sided chest tubes have been inserted. Both have their tips at the right apex. The majority of the right pleural fluid has been evacuated. There is residual discoid and linear opacity at the right lung base consistent with atelectasis. Remainder the right lung is clear. No right pneumothorax. Mild linear left lung base opacity is noted, stable, consistent with atelectasis. Left lung otherwise clear. No left pleural effusion. New right internal jugular central venous line has its tip in the lower superior vena cava. IMPRESSION: 1. Right internal jugular central venous line tip projects in the lower superior vena cava approximately 3 cm above the caval atrial junction. 2. Two right chest tubes have their tips at the right apex. No pneumothorax. There has been significant improvement in right lung aeration with near complete evacuation of the right pleural fluid and significant improvement in right lung base atelectasis. Electronically Signed   By: Amie Portland M.D.   On: 10/22/2016 10:26   US Thoracentesis Asp Pleural Space W/img Guide  Result Date: 10/20/2016 INDICATION: Respiratory failure secondary to community acquired right lower lobe pneumonia with pleural effusion. Request is made for diagnostic and therapeutic thoracentesis. EXAM: ULTRASOUND GUIDED DIAGNOSTIC AND THERAPEUTIC THORACENTESIS MEDICATIONS: 1% lidocaine COMPLICATIONS: None immediate. PROCEDURE:  An ultrasound guided thoracentesis was thoroughly discussed with the patient and questions answered. The benefits, risks, alternatives and complications were also discussed. The patient understands and wishes to proceed with the procedure. Written consent was obtained. Ultrasound was performed to localize and mark an adequate  pocket of fluid in the right chest. The area was then prepped and draped in the normal sterile fashion. 1% Lidocaine was used for local anesthesia. Under ultrasound guidance a Safe-T-Centesis catheter was introduced. Thoracentesis was performed. The catheter was removed and a dressing applied. FINDINGS: A total of approximately 0.5 L of slightly cloudy yellow fluid was removed. There did appear to be a small loculation noted within this very small amount of fluid. Samples were sent to the laboratory as requested by the clinical team. IMPRESSION: Successful ultrasound guided right thoracentesis yielding 0.5 L of pleural fluid. Read by: Barnetta Chapel, PA-C Electronically Signed   By: Gilmer Mor D.O.   On: 10/20/2016 13:52    Time Spent in minutes  25   Eddie North M.D on 10/24/2016 at 2:00 PM  Between 7am to 7pm - Pager - [phone]  After 7pm go to [email or web address] - password [facility]  [facility] -  Office  [phone]

## 2016-10-24 NOTE — Progress Notes (Addendum)
[address]       [place] [address]             [phone]      2 Days Post-Op Procedure(s) (LRB): VIDEO ASSISTED THORACOSCOPY (VATS)/THOROCOTOMY for Empyema  (Right) Subjective: conts to feel better, less SOB  Objective: Vital signs in last 24 hours: Temp:  [97.5 F (36.4 C)-98.7 F (37.1 C)] 98.7 F (37.1 C) (04/22 0347) Pulse Rate:  [55-94] 78 (04/22 0500) Cardiac Rhythm: Normal sinus rhythm (04/22 0740) Resp:  [11-25] 24 (04/22 0500) BP: (122-146)/(59-106) 122/78 (04/22 0500) SpO2:  [95 %-98 %] 96 % (04/22 0825)  Hemodynamic parameters for last 24 hours:    Intake/Output from previous day: 04/21 0701 - 04/22 0700 In: 3201.7 [P.O.:480; I.V.:2371.7; IV Piggyback:350] Out: 2365 [Urine:2275; Chest Tube:90] Intake/Output this shift: No intake/output data recorded.  General appearance: alert, cooperative and no distress Heart: regular rate and rhythm Lungs: dim in bases Abdomen: benign Extremities: no edema Wound: incis healing well  Lab Results:  Recent Labs  10/23/16 0505 10/24/16 0327  WBC 11.9* 9.5  HGB 10.7* 11.6*  HCT 32.9* 35.1*  PLT 371 389   BMET:  Recent Labs  10/23/16 0505 10/24/16 0327  NA 134* 132*  K 4.2 3.8  CL 100* 97*  CO2 27 28  GLUCOSE 106* 97  BUN 7 6  CREATININE 0.64 0.67  CALCIUM 8.1* 8.3*    PT/INR: No results for input(s): LABPROT, INR in the last 72 hours. ABG    Component Value Date/Time   PHART 7.481 (H) 10/23/2016 0450   HCO3 25.7 10/23/2016 0450   O2SAT 99.4 10/23/2016 0450   CBG (last 3)   Recent Labs  10/23/16 1933 10/23/16 2359 10/24/16 0349  GLUCAP 100* 98 95    Meds Scheduled Meds: . acetaminophen  1,000 mg Oral Q6H   Or  . acetaminophen (TYLENOL) oral liquid 160 mg/5 mL  1,000 mg Oral Q6H  . bisacodyl  10 mg Oral Daily  . enoxaparin (LOVENOX) injection  40 mg Subcutaneous Daily  . fentaNYL   Intravenous Q4H  . insulin aspart  0-24 Units Subcutaneous Q4H  .  ipratropium-albuterol  3 mL Nebulization QID  . lisinopril  40 mg Oral Daily  . metFORMIN  500 mg Oral BID WC  . nicotine  7 mg Transdermal Daily  . senna-docusate  1 tablet Oral QHS   Continuous Infusions: . azithromycin Stopped (10/23/16 1818)  . cefTRIAXone (ROCEPHIN)  IV Stopped (10/23/16 1446)  . dextrose 5 % and 0.9 % NaCl with KCl 20 mEq/L 50 mL/hr at 10/24/16 0500  . potassium chloride (KCL MULTIRUN) 30 mEq in 265 mL IVPB     PRN Meds:.diphenhydrAMINE **OR** diphenhydrAMINE, naloxone **AND** sodium chloride flush, ondansetron (ZOFRAN) IV, oxyCODONE, potassium chloride (KCL MULTIRUN) 30 mEq in 265 mL IVPB, traMADol  Xrays Dg Chest Port 1 View  Result Date: 10/24/2016 CLINICAL DATA:  Patient with right-sided chest tube. EXAM: PORTABLE CHEST 1 VIEW COMPARISON:  Chest radiograph 10/23/2016. FINDINGS: Right IJ central venous catheter tip projects over the superior cavoatrial junction. Two right chest tubes remain in place. Monitoring leads overlie the patient. Stable cardiac and mediastinal contours. Interval increase in size of moderate left pleural effusion with underlying heterogeneous opacities. Persistent small right pleural effusion with underlying heterogeneous opacities. Probable tiny right apical pneumothorax. IMPRESSION: Right chest tubes remain in place. Probable tiny right apical pneumothorax. Persistent small right pleural effusion and underlying opacities. Slight interval increase in size  of moderate left pleural effusion and underlying heterogeneous opacities. Electronically Signed   By: Annia Belt M.D.   On: 10/24/2016 07:56   Dg Chest Port 1 View  Result Date: 10/23/2016 CLINICAL DATA:  Patient with history of empyema. Right chest tube in place. EXAM: PORTABLE CHEST 1 VIEW COMPARISON:  Chest radiograph 10/22/2016 FINDINGS: Monitoring leads overlie the patient. Right IJ central venous catheter tip projects over the superior cavoatrial junction. Two right-sided chest tubes  remain in place. Stable cardiac and mediastinal contours. Low lung volumes. Right-greater-than-left basilar heterogeneous opacities. Persistent right pleural fluid/ thickening. Small left pleural fluid/ thickening. New small right pneumothorax. IMPRESSION: New small right pneumothorax.  Right chest tubes remain in position. Stable support apparatus. Persistent right pleural thickening/ fluid with associated heterogeneous opacities at the lung base which may represent atelectasis or infection. Critical Value/emergent results were called by telephone at the time of interpretation on 10/23/2016 at 8:13 am to [facility], who verbally acknowledged these results. Electronically Signed   By: Annia Belt M.D.   On: 10/23/2016 08:14   Dg Chest Port 1 View  Result Date: 10/22/2016 CLINICAL DATA:  Post op central line Lt chest tube. Empyema EXAM: PORTABLE CHEST 1 VIEW COMPARISON:  CT and chest radiograph, 10/20/2016 FINDINGS: Since prior studies, 2 right-sided chest tubes have been inserted. Both have their tips at the right apex. The majority of the right pleural fluid has been evacuated. There is residual discoid and linear opacity at the right lung base consistent with atelectasis. Remainder the right lung is clear. No right pneumothorax. Mild linear left lung base opacity is noted, stable, consistent with atelectasis. Left lung otherwise clear. No left pleural effusion. New right internal jugular central venous line has its tip in the lower superior vena cava. IMPRESSION: 1. Right internal jugular central venous line tip projects in the lower superior vena cava approximately 3 cm above the caval atrial junction. 2. Two right chest tubes have their tips at the right apex. No pneumothorax. There has been significant improvement in right lung aeration with near complete evacuation of the right pleural fluid and significant improvement in right lung base atelectasis. Electronically Signed   By: Amie Portland M.D.   On:  10/22/2016 10:26   Results for orders placed or performed during the hospital encounter of 10/19/16  Culture, blood (routine x 2) Call MD if unable to obtain prior to antibiotics being given     Status: None (Preliminary result)   Collection Time: 10/19/16  7:42 PM  Result Value Ref Range Status   Specimen Description BLOOD BLOOD LEFT ARM  Final   Special Requests   Final    BOTTLES DRAWN AEROBIC ONLY Blood Culture adequate volume   Culture   Final    NO GROWTH 4 DAYS Performed at [facility], [address], [place], [place] [address]    Report Status PENDING  Incomplete  Culture, blood (routine x 2) Call MD if unable to obtain prior to antibiotics being given     Status: None (Preliminary result)   Collection Time: 10/19/16  7:45 PM  Result Value Ref Range Status   Specimen Description BLOOD BLOOD LEFT ARM  Final   Special Requests   Final    BOTTLES DRAWN AEROBIC ONLY Blood Culture adequate volume   Culture   Final    NO GROWTH 4 DAYS Performed at [facility], [address], [place], [place] [address]    Report Status PENDING  Incomplete  Respiratory Panel by PCR     Status: None   Collection Time: 10/20/16  7:34 AM  Result Value Ref Range Status   Adenovirus NOT DETECTED NOT DETECTED Final   Coronavirus 229E NOT DETECTED NOT DETECTED Final   Coronavirus HKU1 NOT DETECTED NOT DETECTED Final   Coronavirus NL63 NOT DETECTED NOT DETECTED Final   Coronavirus OC43 NOT DETECTED NOT DETECTED Final   Metapneumovirus NOT DETECTED NOT DETECTED Final   Rhinovirus / Enterovirus NOT DETECTED NOT DETECTED Final   Influenza A NOT DETECTED NOT DETECTED Final   Influenza B NOT DETECTED NOT DETECTED Final   Parainfluenza Virus 1 NOT DETECTED NOT DETECTED Final   Parainfluenza Virus 2 NOT DETECTED NOT DETECTED Final   Parainfluenza Virus 3 NOT DETECTED NOT DETECTED Final   Parainfluenza Virus 4 NOT DETECTED NOT DETECTED Final   Respiratory Syncytial Virus NOT DETECTED NOT  DETECTED Final   Bordetella pertussis NOT DETECTED NOT DETECTED Final   Chlamydophila pneumoniae NOT DETECTED NOT DETECTED Final   Mycoplasma pneumoniae NOT DETECTED NOT DETECTED Final    Comment: Performed at [facility], [address] [place], [place] [address]  Culture, body fluid-bottle     Status: None (Preliminary result)   Collection Time: 10/20/16  1:01 PM  Result Value Ref Range Status   Specimen Description FLUID RIGHT PLEURAL  Final   Special Requests NONE  Final   Culture   Final    NO GROWTH 3 DAYS Performed at [facility], [address], [place], [place] [address]    Report Status PENDING  Incomplete  Gram stain     Status: None   Collection Time: 10/20/16  1:01 PM  Result Value Ref Range Status   Specimen Description FLUID RIGHT PLEURAL  Final   Special Requests NONE  Final   Gram Stain   Final    ABUNDANT WBC PRESENT,BOTH PMN AND MONONUCLEAR NO ORGANISMS SEEN Performed at [facility], [address], [place], [place] [address]    Report Status 10/20/2016 FINAL  Final  Culture, sputum-assessment     Status: None   Collection Time: 10/20/16  4:09 PM  Result Value Ref Range Status   Specimen Description SPUTUM  Final   Special Requests NONE  Final   Sputum evaluation THIS SPECIMEN IS ACCEPTABLE FOR SPUTUM CULTURE  Final   Report Status 10/20/2016 FINAL  Final  Culture, respiratory (NON-Expectorated)     Status: None   Collection Time: 10/20/16  4:09 PM  Result Value Ref Range Status   Specimen Description SPUTUM  Final   Special Requests NONE Reflexed from [id number]  Final   Gram Stain   Final    RARE WBC PRESENT, PREDOMINANTLY PMN RARE GRAM POSITIVE COCCI IN PAIRS RARE GRAM POSITIVE RODS    Culture   Final    Consistent with normal respiratory flora. Performed at [facility], [address] [place], [place] [address]    Report Status 10/23/2016 FINAL  Final  Culture, body fluid-bottle     Status: None (Preliminary result)    Collection Time: 10/22/16  8:36 AM  Result Value Ref Range Status   Specimen Description PLEURAL RIGHT  Final   Special Requests NONE  Final   Culture NO GROWTH 1 DAY  Final   Report Status PENDING  Incomplete  Gram stain     Status: None   Collection Time: 10/22/16  8:36 AM  Result Value Ref Range Status   Specimen  Description PLEURAL RIGHT  Final   Special Requests NONE  Final   Gram Stain   Final    MODERATE WBC PRESENT,BOTH PMN AND MONONUCLEAR NO ORGANISMS SEEN    Report Status 10/22/2016 FINAL  Final  Aerobic Culture (superficial specimen)     Status: None (Preliminary result)   Collection Time: 10/22/16  8:55 AM  Result Value Ref Range Status   Specimen Description TISSUE RIGHT PLEURAL  Final   Special Requests PEEL  Final   Gram Stain   Final    MODERATE WBC PRESENT, PREDOMINANTLY PMN NO ORGANISMS SEEN    Culture NO GROWTH 1 DAY  Final   Report Status PENDING  Incomplete  MRSA PCR Screening     Status: None   Collection Time: 10/23/16  2:16 AM  Result Value Ref Range Status   MRSA by PCR NEGATIVE NEGATIVE Final    Comment:        The GeneXpert MRSA Assay (FDA approved for NASAL specimens only), is one component of a comprehensive MRSA colonization surveillance program. It is not intended to diagnose MRSA infection nor to guide or monitor treatment for MRSA infections.    Assessment/Plan: S/P Procedure(s) (LRB): VIDEO ASSISTED THORACOSCOPY (VATS)/THOROCOTOMY for Empyema  (Right)  1 doing well 2 no air leak, 160 cc serous drainage from tube, place to H20 seal and possibly d/c one tube today 3 cultures neg thus far, leukocytosis resolved 4 afeb, hemodyn stable, restart ome norvasc dose 5 H/H improved, mild hyponatremia 6 push rehab and pulm toilet as able 7 cont PCA for now  LOS: 5 days    GOLD,WAYNE E 10/24/2016 DC ant tube Doing well patient examined and medical record reviewed,agree with above note. Kathlee Nations Trigt III 10/24/2016

## 2016-10-25 ENCOUNTER — Inpatient Hospital Stay (HOSPITAL_COMMUNITY): Payer: Self-pay

## 2016-10-25 DIAGNOSIS — J918 Pleural effusion in other conditions classified elsewhere: Secondary | ICD-10-CM

## 2016-10-25 DIAGNOSIS — J189 Pneumonia, unspecified organism: Secondary | ICD-10-CM

## 2016-10-25 LAB — BPAM RBC
Blood Product Expiration Date: 201805012359
Blood Product Expiration Date: 201805032359
ISSUE DATE / TIME: 201804161049
ISSUE DATE / TIME: 201804170836
UNIT TYPE AND RH: 6200
Unit Type and Rh: 6200

## 2016-10-25 LAB — TYPE AND SCREEN
ABO/RH(D): A POS
Antibody Screen: NEGATIVE
Unit division: 0
Unit division: 0

## 2016-10-25 LAB — CULTURE, BODY FLUID W GRAM STAIN -BOTTLE

## 2016-10-25 LAB — BASIC METABOLIC PANEL
Anion gap: 11 (ref 5–15)
BUN: 7 mg/dL (ref 6–20)
CO2: 27 mmol/L (ref 22–32)
Calcium: 8.9 mg/dL (ref 8.9–10.3)
Chloride: 93 mmol/L — ABNORMAL LOW (ref 101–111)
Creatinine, Ser: 0.64 mg/dL (ref 0.61–1.24)
GFR calc Af Amer: >60 mL/min (ref >60)
GFR calc non Af Amer: >60 mL/min (ref >60)
Glucose, Bld: 108 mg/dL — ABNORMAL HIGH (ref 65–99)
Potassium: 4.2 mmol/L (ref 3.5–5.1)
Sodium: 131 mmol/L — ABNORMAL LOW (ref 135–145)

## 2016-10-25 LAB — CBC
HCT: 36.5 % — ABNORMAL LOW (ref 39.0–52.0)
Hemoglobin: 12.3 g/dL — ABNORMAL LOW (ref 13.0–17.0)
MCH: 23.8 pg — ABNORMAL LOW (ref 26.0–34.0)
MCHC: 33.7 g/dL (ref 30.0–36.0)
MCV: 70.7 fL — ABNORMAL LOW (ref 78.0–100.0)
Platelets: 424 10*3/uL — ABNORMAL HIGH (ref 150–400)
RBC: 5.16 MIL/uL (ref 4.22–5.81)
RDW: 14 % (ref 11.5–15.5)
WBC: 11.1 10*3/uL — ABNORMAL HIGH (ref 4.0–10.5)

## 2016-10-25 LAB — CULTURE, BODY FLUID-BOTTLE: CULTURE: NO GROWTH

## 2016-10-25 LAB — GLUCOSE, CAPILLARY
GLUCOSE-CAPILLARY: 122 mg/dL — AB (ref 65–99)
GLUCOSE-CAPILLARY: 104 mg/dL — AB (ref 65–99)
Glucose-Capillary: 104 mg/dL — ABNORMAL HIGH (ref 65–99)
Glucose-Capillary: 106 mg/dL — ABNORMAL HIGH (ref 65–99)
Glucose-Capillary: 113 mg/dL — ABNORMAL HIGH (ref 65–99)
Glucose-Capillary: 131 mg/dL — ABNORMAL HIGH (ref 65–99)

## 2016-10-25 LAB — AEROBIC CULTURE W GRAM STAIN (SUPERFICIAL SPECIMEN): Culture: NO GROWTH

## 2016-10-25 LAB — AEROBIC CULTURE  (SUPERFICIAL SPECIMEN)

## 2016-10-25 MED ORDER — FAMOTIDINE 20 MG PO TABS
20.0000 mg | ORAL_TABLET | Freq: Two times a day (BID) | ORAL | Status: DC
Start: 2016-10-25 — End: 2016-10-26
  Administered 2016-10-25 – 2016-10-26 (×3): 20 mg via ORAL
  Filled 2016-10-25 (×3): qty 1

## 2016-10-25 MED ORDER — IPRATROPIUM-ALBUTEROL 0.5-2.5 (3) MG/3ML IN SOLN: 3.0000 mL | Freq: Four times a day (QID) | RESPIRATORY_TRACT | Status: DC | PRN

## 2016-10-25 MED ORDER — ALUM & MAG HYDROXIDE-SIMETH 200-200-20 MG/5ML PO SUSP
15.0000 mL | Freq: Four times a day (QID) | ORAL | Status: DC | PRN
  Administered 2016-10-25: 15 mL via ORAL
  Filled 2016-10-25: qty 30

## 2016-10-25 NOTE — Progress Notes (Addendum)
3 Days Post-Op Procedure(s) (LRB): VIDEO ASSISTED THORACOSCOPY (VATS)/THOROCOTOMY for Empyema  (Right) Subjective: Complaints of burning in his epigastrium. Improved with Maalox.  Objective: Vital signs in last 24 hours: Temp:  [98 F (36.7 C)-98.3 F (36.8 C)] 98.3 F (36.8 C) (04/23 0322) Pulse Rate:  [74-90] 74 (04/23 0322) Cardiac Rhythm: (P) Normal sinus rhythm (04/23 0322) Resp:  [14-29] 19 (04/23 0322) BP: (130-153)/(87-102) 130/87 (04/23 0322) SpO2:  [92 %-97 %] 95 % (04/23 0322)  Hemodynamic parameters for last 24 hours:    Intake/Output from previous day: 04/22 0701 - 04/23 0700 In: 600 [P.O.:600] Out: 2075 [Urine:1975; Chest Tube:100] Intake/Output this shift: Total I/O In: 240 [P.O.:240] Out: 240 [Urine:200; Chest Tube:40]  General appearance: alert and cooperative Heart: regular rate and rhythm, S1, S2 normal, no murmur, click, rub or gallop Lungs: rhonchi bilaterally Wound: ok chest tube output low and serous. abd: soft nontender, bowel sounds normal  Lab Results:  Recent Labs  10/24/16 0327 10/25/16 0505  WBC 9.5 11.1*  HGB 11.6* 12.3*  HCT 35.1* 36.5*  PLT 389 424*   BMET:  Recent Labs  10/24/16 0327 10/25/16 0505  NA 132* 131*  K 3.8 4.2  CL 97* 93*  CO2 28 27  GLUCOSE 97 108*  BUN 6 7  CREATININE 0.67 0.64  CALCIUM 8.3* 8.9    PT/INR: No results for input(s): LABPROT, INR in the last 72 hours. ABG    Component Value Date/Time   PHART 7.481 (H) 10/23/2016 0450   HCO3 25.7 10/23/2016 0450   O2SAT 99.4 10/23/2016 0450   CBG (last 3)   Recent Labs  10/24/16 2007 10/24/16 2309 10/25/16 0333  GLUCAP 104* 125* 122*   CXR: looks good. Minimal atelectasis at right base.  Assessment/Plan: S/P Procedure(s) (LRB): VIDEO ASSISTED THORACOSCOPY (VATS)/THOROCOTOMY for Empyema  (Right)  POD 3.   Chest tube output minimal and the cultures from OR are negative so far. Will remove the remaining chest tube.  DC PCA and use oral pain  meds Ambulate and IS 2V CXR in the am If CXR looks ok he can go home on oral antibiotics when ok with medicine team. Add Pepcid for epigastric burning.   LOS: 6 days    Alleen Borne 10/25/2016

## 2016-10-25 NOTE — Progress Notes (Addendum)
PROGRESS NOTE                                                                                                                                                                                                             Patient Demographics:    Gerald Wheeler, is a 56 y.o. male, DOB - [birth year], ZOX:[id number]  Admit date - 10/19/2016   Admitting Physician Alba Cory, MD  Outpatient Primary MD for the patient is HAYES, Natale Lay, FNP  LOS - 6  Outpatient Specialists:None  Chief Complaint  Patient presents with  . Cough       Brief Narrative   56 year old male with history of hypertension and diabetes resent it to the ED with progressive cough followed by dyspnea on exertion. In the ED he was found to have right lobar pneumonia with large pleural effusion. Patient underwent thoracenteses with 500 mL fluid removed showing significant WBC. A CT of the chest was done which showed 80 as of localization. Patient seen by CT VS and underwent VATS on 4/20.   Subjective:   Second chest tube removed this morning. Pain better. Complained of epigastric burning which improved with Maalox and Pepcid.  Assessment  & Plan :    Principal Problem:   Right lower lobe pneumonia (HCC)   Empyema (HCC) Underwent VATS on 4/20. Both the chest tube removed this morning.  Continue oxycodone and tramadol for pain.Completes 7 days course tomorrow.  Supportive care with Tylenol and antitussives.CT VS consult appreciated. Follow-up chest x-ray in a.m. and if stable can be discharged home. Encouraged ambulation. Bedside spirometry.  Active Problems:  Acute respiratory failure with hypoxia Secondary to pneumonia and empyema. Now improved.    Essential hypertension Stable. Continue amlodipine.    Tobacco abuse Counseled on cessation. Continue nicotine patch.    Diabetes mellitus type 2, noninsulin dependent (HCC) Continue metformin and  SSI. CBG stable.    Hyponatremia Secondary to pneumonia. Currently improved.  Dyspepsia Continue when necessary Maalox and Pepcid.    Code Status : Full code  Family Communication  : None at bedside  Disposition Plan  : Home in a.m. if follow-up chest x-ray stable  Barriers For Discharge : Improving symptoms  Consults  : CTVS  Procedures  :  CT chest Thoracentesis VATS on 4/20  DVT Prophylaxis  :  Lovenox -  Lab Results  Component Value Date   PLT 424 (H) 10/25/2016    Antibiotics  :    Anti-infectives    Start     Dose/Rate Route Frequency Ordered Stop   10/24/16 1800  azithromycin (ZITHROMAX) tablet 500 mg     500 mg Oral Every 24 hours 10/24/16 1131 10/27/16 1759   10/20/16 1400  cefTRIAXone (ROCEPHIN) 1 g in dextrose 5 % 50 mL IVPB     1 g 100 mL/hr over 30 Minutes Intravenous Every 24 hours 10/19/16 1833 10/27/16 1359   10/20/16 1400  azithromycin (ZITHROMAX) 500 mg in dextrose 5 % 250 mL IVPB  Status:  Discontinued     500 mg 250 mL/hr over 60 Minutes Intravenous Every 24 hours 10/19/16 1833 10/24/16 1131   10/19/16 1345  cefTRIAXone (ROCEPHIN) 1 g in dextrose 5 % 50 mL IVPB     1 g 100 mL/hr over 30 Minutes Intravenous  Once 10/19/16 1338 10/19/16 1412   10/19/16 1345  azithromycin (ZITHROMAX) 500 mg in dextrose 5 % 250 mL IVPB     500 mg 250 mL/hr over 60 Minutes Intravenous  Once 10/19/16 1338 10/19/16 1539   10/19/16 1345  azithromycin (ZITHROMAX) 500 MG injection    Comments:  Lucendia Herrlich   : cabinet override      10/19/16 1345 10/20/16 0159        Objective:   Vitals:   10/25/16 0322 10/25/16 0734 10/25/16 0824 10/25/16 1124  BP: 130/87 114/80  120/70  Pulse: 74 72  74  Resp: 19 (!) 23  18  Temp: 98.3 F (36.8 C) 98 F (36.7 C)  98 F (36.7 C)  TempSrc: Oral Oral  Oral  SpO2: 95% 94% 96% 92%  Weight:      Height:        Wt Readings from Last 3 Encounters:  10/22/16 110.9 kg (244 lb 7.8 oz)  02/27/16 107 kg (236 lb)  01/25/16  104.3 kg (230 lb)     Intake/Output Summary (Last 24 hours) at 10/25/16 1132 Last data filed at 10/25/16 0800  Gross per 24 hour  Intake              600 ml  Output             2465 ml  Net            -1865 ml     Physical Exam  Gen: not in distress HEENT: moist mucosa, supple neck Chest:Improved breath sounds over bilateral lung. Clean Dressing lower chest tube removal site  CVS: N S1&S2, no murmurs GI: soft, NT, ND, Musculoskeletal: warm, no edema     Data Review:    CBC  Recent Labs Lab 10/19/16 1258 10/20/16 0623 10/23/16 0505 10/24/16 0327 10/25/16 0505  WBC 11.4* 13.3* 11.9* 9.5 11.1*  HGB 12.9* 12.0* 10.7* 11.6* 12.3*  HCT 38.6* 36.9* 32.9* 35.1* 36.5*  PLT 346 342 371 389 424*  MCV 71.5* 72.9* 72.0* 71.5* 70.7*  MCH 23.9* 23.7* 23.4* 23.6* 23.8*  MCHC 33.4 32.5 32.5 33.0 33.7  RDW 15.0 15.1 14.6 14.4 14.0  LYMPHSABS  --  0.7  --   --   --   MONOABS  --  0.7  --   --   --   EOSABS  --  0.0  --   --   --   BASOSABS  --  0.0  --   --   --     Chemistries  Recent Labs Lab 10/19/16 1258 10/20/16 0623 10/23/16 0505 10/24/16 0327 10/25/16 0505  NA 132* 134* 134* 132* 131*  K 4.5 3.8 4.2 3.8 4.2  CL 99* 102 100* 97* 93*  CO2 23 21* GLUCOSE 118* 124* 106* 97 108*  BUN CREATININE 0.79 0.69 0.64 0.67 0.64  CALCIUM 9.2 8.8* 8.1* 8.3* 8.9  MG  --  1.9  --   --   --   AST 24 13* 21 17  --   ALT 20 14* 42 34  --   ALKPHOS 59 54 45 49  --   BILITOT 0.4 0.6 0.5 0.4  --    ------------------------------------------------------------------------------------------------------------------ No results for input(s): CHOL, HDL, LDLCALC, TRIG, CHOLHDL, LDLDIRECT in the last 72 hours.  No results found for: HGBA1C ------------------------------------------------------------------------------------------------------------------ No results for input(s): TSH, T4TOTAL, T3FREE, THYROIDAB in the last 72 hours.  Invalid input(s):  FREET3 ------------------------------------------------------------------------------------------------------------------ No results for input(s): VITAMINB12, FOLATE, FERRITIN, TIBC, IRON, RETICCTPCT in the last 72 hours.  Coagulation profile No results for input(s): INR, PROTIME in the last 168 hours.  No results for input(s): DDIMER in the last 72 hours.  Cardiac Enzymes  Recent Labs Lab 10/19/16 1258  TROPONINI <0.03   ------------------------------------------------------------------------------------------------------------------    Component Value Date/Time   BNP 12.8 01/25/2016 1805    Inpatient Medications  Scheduled Meds: . acetaminophen  1,000 mg Oral Q6H   Or  . acetaminophen (TYLENOL) oral liquid 160 mg/5 mL  1,000 mg Oral Q6H  . amLODipine  5 mg Oral Daily  . azithromycin  500 mg Oral Q24H  . bisacodyl  10 mg Oral Daily  . enoxaparin (LOVENOX) injection  40 mg Subcutaneous Daily  . famotidine  20 mg Oral BID  . insulin aspart  0-24 Units Subcutaneous Q4H  . lisinopril  40 mg Oral Daily  . metFORMIN  500 mg Oral BID WC  . nicotine  7 mg Transdermal Daily  . senna-docusate  1 tablet Oral QHS   Continuous Infusions: . cefTRIAXone (ROCEPHIN)  IV Stopped (10/24/16 1336)  . potassium chloride (KCL MULTIRUN) 30 mEq in 265 mL IVPB     PRN Meds:.alum & mag hydroxide-simeth, ipratropium-albuterol, ondansetron (ZOFRAN) IV, oxyCODONE, potassium chloride (KCL MULTIRUN) 30 mEq in 265 mL IVPB, traMADol  Micro Results Recent Results (from the past 240 hour(s))  Culture, blood (routine x 2) Call MD if unable to obtain prior to antibiotics being given     Status: None   Collection Time: 10/19/16  7:42 PM  Result Value Ref Range Status   Specimen Description BLOOD BLOOD LEFT ARM  Final   Special Requests   Final    BOTTLES DRAWN AEROBIC ONLY Blood Culture adequate volume   Culture   Final    NO GROWTH 5 DAYS Performed at [facility], [address],  [place], [place] [address]    Report Status 10/24/2016 FINAL  Final  Culture, blood (routine x 2) Call MD if unable to obtain prior to antibiotics being given     Status: None   Collection Time: 10/19/16  7:45 PM  Result Value Ref Range Status   Specimen Description BLOOD BLOOD LEFT ARM  Final   Special Requests   Final    BOTTLES DRAWN AEROBIC ONLY Blood Culture adequate volume   Culture   Final    NO GROWTH 5 DAYS Performed at [facility], [address] [place], [place] [address]  Report Status 10/24/2016 FINAL  Final  Respiratory Panel by PCR     Status: None   Collection Time: 10/20/16  7:34 AM  Result Value Ref Range Status   Adenovirus NOT DETECTED NOT DETECTED Final   Coronavirus 229E NOT DETECTED NOT DETECTED Final   Coronavirus HKU1 NOT DETECTED NOT DETECTED Final   Coronavirus NL63 NOT DETECTED NOT DETECTED Final   Coronavirus OC43 NOT DETECTED NOT DETECTED Final   Metapneumovirus NOT DETECTED NOT DETECTED Final   Rhinovirus / Enterovirus NOT DETECTED NOT DETECTED Final   Influenza A NOT DETECTED NOT DETECTED Final   Influenza B NOT DETECTED NOT DETECTED Final   Parainfluenza Virus 1 NOT DETECTED NOT DETECTED Final   Parainfluenza Virus 2 NOT DETECTED NOT DETECTED Final   Parainfluenza Virus 3 NOT DETECTED NOT DETECTED Final   Parainfluenza Virus 4 NOT DETECTED NOT DETECTED Final   Respiratory Syncytial Virus NOT DETECTED NOT DETECTED Final   Bordetella pertussis NOT DETECTED NOT DETECTED Final   Chlamydophila pneumoniae NOT DETECTED NOT DETECTED Final   Mycoplasma pneumoniae NOT DETECTED NOT DETECTED Final    Comment: Performed at [facility], [address], [place], [place] [address]  Culture, body fluid-bottle     Status: None (Preliminary result)   Collection Time: 10/20/16  1:01 PM  Result Value Ref Range Status   Specimen Description FLUID RIGHT PLEURAL  Final   Special Requests NONE  Final   Culture   Final    NO GROWTH 4 DAYS Performed at [facility], [address], [place], [place] [address]    Report Status PENDING  Incomplete  Gram stain     Status: None   Collection Time: 10/20/16  1:01 PM  Result Value Ref Range Status   Specimen Description FLUID RIGHT PLEURAL  Final   Special Requests NONE  Final   Gram Stain   Final    ABUNDANT WBC PRESENT,BOTH PMN AND MONONUCLEAR NO ORGANISMS SEEN Performed at [facility], [address], [place], [place] [address]    Report Status 10/20/2016 FINAL  Final  Culture, sputum-assessment     Status: None   Collection Time: 10/20/16  4:09 PM  Result Value Ref Range Status   Specimen Description SPUTUM  Final   Special Requests NONE  Final   Sputum evaluation THIS SPECIMEN IS ACCEPTABLE FOR SPUTUM CULTURE  Final   Report Status 10/20/2016 FINAL  Final  Culture, respiratory (NON-Expectorated)     Status: None   Collection Time: 10/20/16  4:09 PM  Result Value Ref Range Status   Specimen Description SPUTUM  Final   Special Requests NONE Reflexed from [id number]  Final   Gram Stain   Final    RARE WBC PRESENT, PREDOMINANTLY PMN RARE GRAM POSITIVE COCCI IN PAIRS RARE GRAM POSITIVE RODS    Culture   Final    Consistent with normal respiratory flora. Performed at [facility], [address], [place], [place] [address]    Report Status 10/23/2016 FINAL  Final  Culture, body fluid-bottle     Status: None (Preliminary result)   Collection Time: 10/22/16  8:36 AM  Result Value Ref Range Status   Specimen Description PLEURAL RIGHT  Final   Special Requests NONE  Final   Culture NO GROWTH 2 DAYS  Final   Report Status PENDING  Incomplete  Gram stain     Status: None   Collection Time: 10/22/16  8:36 AM  Result Value  Ref Range Status   Specimen Description PLEURAL RIGHT  Final   Special Requests NONE  Final   Gram Stain   Final    MODERATE WBC PRESENT,BOTH PMN AND MONONUCLEAR NO ORGANISMS SEEN    Report Status 10/22/2016 FINAL  Final  Anaerobic culture     Status:  None (Preliminary result)   Collection Time: 10/22/16  8:55 AM  Result Value Ref Range Status   Specimen Description TISSUE RIGHT PLEURAL  Final   Special Requests PEEL  Final   Culture   Final    NO ANAEROBES ISOLATED; CULTURE IN PROGRESS FOR 5 DAYS   Report Status PENDING  Incomplete  Aerobic Culture (superficial specimen)     Status: None (Preliminary result)   Collection Time: 10/22/16  8:55 AM  Result Value Ref Range Status   Specimen Description TISSUE RIGHT PLEURAL  Final   Special Requests PEEL  Final   Gram Stain   Final    MODERATE WBC PRESENT, PREDOMINANTLY PMN NO ORGANISMS SEEN    Culture NO GROWTH 1 DAY  Final   Report Status PENDING  Incomplete  MRSA PCR Screening     Status: None   Collection Time: 10/23/16  2:16 AM  Result Value Ref Range Status   MRSA by PCR NEGATIVE NEGATIVE Final    Comment:        The GeneXpert MRSA Assay (FDA approved for NASAL specimens only), is one component of a comprehensive MRSA colonization surveillance program. It is not intended to diagnose MRSA infection nor to guide or monitor treatment for MRSA infections.     Radiology Reports Dg Chest 2 View  Result Date: 10/20/2016 CLINICAL DATA:  Post right thoracentesis EXAM: CHEST  2 VIEW COMPARISON:  10/19/2016 FINDINGS: Moderate right pleural effusion post thoracentesis is unchanged from yesterday. No pneumothorax. Right lower lobe atelectasis/ infiltrate unchanged. Mild left lower lobe atelectasis.  Negative for heart failure IMPRESSION: Negative for pneumothorax post right thoracentesis. No change in moderate right pleural effusion and right lower lobe airspace disease. Electronically Signed   By: Marlan Palau M.D.   On: 10/20/2016 10:52   Dg Chest 2 View  Result Date: 10/19/2016 CLINICAL DATA:  Cough, congestion, shortness of breath EXAM: CHEST  2 VIEW COMPARISON:  01/25/2016 FINDINGS: New small to moderate right pleural effusion with right lower lung collapse/consolidation.  Appearance is compatible with right basilar pneumonia and parapneumonic effusion. Minor left base atelectasis. Upper lobes remain clear. Normal heart size and vascularity. Trachea is midline. Normal bowel gas pattern. Minor thoracic spondylosis. IMPRESSION: Right lower lung consolidation and small to moderate right pleural effusion compatible with pneumonia, as above. Recommend radiographic follow-up to document resolution. Electronically Signed   By: Judie Petit.  Shick M.D.   On: 10/19/2016 13:28   Ct Chest W Contrast  Result Date: 10/20/2016 CLINICAL DATA:  Pneumonia.  Effusion.  Thoracentesis. EXAM: CT CHEST WITH CONTRAST TECHNIQUE: Multidetector CT imaging of the chest was performed during intravenous contrast administration. CONTRAST:  75mL ISOVUE-300 IOPAMIDOL (ISOVUE-300) INJECTION 61% COMPARISON:  Radiography same day.  Radiography 10/19/2016 FINDINGS: Cardiovascular: Aortic atherosclerosis. No aneurysm or dissection. Extensive coronary artery calcification. Small amount of pericardial fluid. Pulmonary arteries appear unremarkable. Mediastinum/Nodes: No mediastinal mass or lymphadenopathy. Lungs/Pleura: There is mild atelectasis at the left lung base. The left lung is otherwise clear. On the right, there is pleural fluid which largely layers dependently but shows some loculation. There is complete collapse of the right lower lobe and near complete collapse of the right middle lobe.  Right upper lobe remains largely inflated. Both lungs show minimal areas of emphysema at the apices. Upper Abdomen: Negative Musculoskeletal: Normal IMPRESSION: Right pleural effusion, largely dependent but with some areas of loculation. Complete collapse of the right lower lobe and right middle lobe. Right upper lobe almost completely inflated. Dependent atelectasis at the left lung base. No pleural effusion seen on the left. Aortic atherosclerosis. Extensive coronary artery calcification. Small amount of pericardial fluid.  Electronically Signed   By: Paulina Fusi M.D.   On: 10/20/2016 21:31   Dg Chest Port 1 View  Result Date: 10/25/2016 CLINICAL DATA:  Chest tube in place.  Diabetes. EXAM: PORTABLE CHEST 1 VIEW COMPARISON:  10/24/2016 FINDINGS: Right internal jugular line tip at low SVC. 1 of 2 right-sided chest tubes has been removed. Persistent subcutaneous emphysema about the right hemithorax. 5% right apical pneumothorax is unchanged. Midline trachea. Borderline cardiomegaly. No pleural fluid. Slight improvement in mild bibasilar atelectasis. IMPRESSION: Removal of 1 of 2 right chest tubes with similar 5% right apical pneumothorax. Improved inspiratory effort with persistent bibasilar atelectasis. Electronically Signed   By: Jeronimo Greaves M.D.   On: 10/25/2016 07:52   Dg Chest Port 1 View  Result Date: 10/24/2016 CLINICAL DATA:  Patient with right-sided chest tube. EXAM: PORTABLE CHEST 1 VIEW COMPARISON:  Chest radiograph 10/23/2016. FINDINGS: Right IJ central venous catheter tip projects over the superior cavoatrial junction. Two right chest tubes remain in place. Monitoring leads overlie the patient. Stable cardiac and mediastinal contours. Interval increase in size of moderate left pleural effusion with underlying heterogeneous opacities. Persistent small right pleural effusion with underlying heterogeneous opacities. Probable tiny right apical pneumothorax. IMPRESSION: Right chest tubes remain in place. Probable tiny right apical pneumothorax. Persistent small right pleural effusion and underlying opacities. Slight interval increase in size of moderate left pleural effusion and underlying heterogeneous opacities. Electronically Signed   By: Annia Belt M.D.   On: 10/24/2016 07:56   Dg Chest Port 1 View  Result Date: 10/23/2016 CLINICAL DATA:  Patient with history of empyema. Right chest tube in place. EXAM: PORTABLE CHEST 1 VIEW COMPARISON:  Chest radiograph 10/22/2016 FINDINGS: Monitoring leads overlie the  patient. Right IJ central venous catheter tip projects over the superior cavoatrial junction. Two right-sided chest tubes remain in place. Stable cardiac and mediastinal contours. Low lung volumes. Right-greater-than-left basilar heterogeneous opacities. Persistent right pleural fluid/ thickening. Small left pleural fluid/ thickening. New small right pneumothorax. IMPRESSION: New small right pneumothorax.  Right chest tubes remain in position. Stable support apparatus. Persistent right pleural thickening/ fluid with associated heterogeneous opacities at the lung base which may represent atelectasis or infection. Critical Value/emergent results were called by telephone at the time of interpretation on 10/23/2016 at 8:13 am to [facility], who verbally acknowledged these results. Electronically Signed   By: Annia Belt M.D.   On: 10/23/2016 08:14   Dg Chest Port 1 View  Result Date: 10/22/2016 CLINICAL DATA:  Post op central line Lt chest tube. Empyema EXAM: PORTABLE CHEST 1 VIEW COMPARISON:  CT and chest radiograph, 10/20/2016 FINDINGS: Since prior studies, 2 right-sided chest tubes have been inserted. Both have their tips at the right apex. The majority of the right pleural fluid has been evacuated. There is residual discoid and linear opacity at the right lung base consistent with atelectasis. Remainder the right lung is clear. No right pneumothorax. Mild linear left lung base opacity is noted, stable, consistent with atelectasis. Left lung otherwise clear. No left pleural effusion. New right  internal jugular central venous line has its tip in the lower superior vena cava. IMPRESSION: 1. Right internal jugular central venous line tip projects in the lower superior vena cava approximately 3 cm above the caval atrial junction. 2. Two right chest tubes have their tips at the right apex. No pneumothorax. There has been significant improvement in right lung aeration with near complete evacuation of the right pleural  fluid and significant improvement in right lung base atelectasis. Electronically Signed   By: Amie Portland M.D.   On: 10/22/2016 10:26   US Thoracentesis Asp Pleural Space W/img Guide  Result Date: 10/20/2016 INDICATION: Respiratory failure secondary to community acquired right lower lobe pneumonia with pleural effusion. Request is made for diagnostic and therapeutic thoracentesis. EXAM: ULTRASOUND GUIDED DIAGNOSTIC AND THERAPEUTIC THORACENTESIS MEDICATIONS: 1% lidocaine COMPLICATIONS: None immediate. PROCEDURE: An ultrasound guided thoracentesis was thoroughly discussed with the patient and questions answered. The benefits, risks, alternatives and complications were also discussed. The patient understands and wishes to proceed with the procedure. Written consent was obtained. Ultrasound was performed to localize and mark an adequate pocket of fluid in the right chest. The area was then prepped and draped in the normal sterile fashion. 1% Lidocaine was used for local anesthesia. Under ultrasound guidance a Safe-T-Centesis catheter was introduced. Thoracentesis was performed. The catheter was removed and a dressing applied. FINDINGS: A total of approximately 0.5 L of slightly cloudy yellow fluid was removed. There did appear to be a small loculation noted within this very small amount of fluid. Samples were sent to the laboratory as requested by the clinical team. IMPRESSION: Successful ultrasound guided right thoracentesis yielding 0.5 L of pleural fluid. Read by: Barnetta Chapel, PA-C Electronically Signed   By: Gilmer Mor D.O.   On: 10/20/2016 13:52    Time Spent in minutes  25   Eddie North M.D on 10/25/2016 at 11:32 AM  Between 7am to 7pm - Pager - [phone]  After 7pm go to [email or web address] - password [facility]  [facility] -  Office  [phone]

## 2016-10-25 NOTE — Care Management Note (Signed)
Case Management Note  Patient Details  Name: SHYHEIM TANNEY MRN: [id number] Date of Birth: 1961/01/03  Subjective/Objective:  From home with room mate, pta indep.  POD 3 VATS, thorocotomy for empyema , removed remaining chest tube today, for cxr in am, DC PCA.  [facility] [place], he just had an apt last Monday.  He goes to [facility] in [place] to get his meds.  He will need medication ast at dc.  NCM will ast with Match Letter. He states he has transportation at discharge.                   Action/Plan:   Expected Discharge Date:                  Expected Discharge Plan:  Home/Self Care  In-House Referral:     Discharge planning Services  CM Consult, MATCH Program, [facility], Medication Assistance  Post Acute Care Choice:    Choice offered to:     DME Arranged:    DME Agency:     HH Arranged:    HH Agency:     Status of Service:  In process, will continue to follow  If discussed at Long Length of Stay Meetings, dates discussed:    Additional Comments:  Leone Haven, RN 10/25/2016, 5:02 PM

## 2016-10-25 NOTE — Clinical Social Work Note (Signed)
Per RN, patient had questions about getting Medicaid. CSW spoke with financial counselor who stated that the patient was screened at [facility] before transferring to [facility]. Financial counselor stated that based on the information available, the patient is not eligible for Medicaid due to his diagnoses. Patient updated.  CSW signing off. Consult again if any other social work needs arise.  Charlynn Court, CSW [phone]

## 2016-10-26 ENCOUNTER — Inpatient Hospital Stay (HOSPITAL_COMMUNITY): Payer: Self-pay

## 2016-10-26 DIAGNOSIS — J189 Pneumonia, unspecified organism: Secondary | ICD-10-CM

## 2016-10-26 DIAGNOSIS — J181 Lobar pneumonia, unspecified organism: Secondary | ICD-10-CM

## 2016-10-26 LAB — GLUCOSE, CAPILLARY
GLUCOSE-CAPILLARY: 92 mg/dL (ref 65–99)
GLUCOSE-CAPILLARY: 101 mg/dL — AB (ref 65–99)
Glucose-Capillary: 81 mg/dL (ref 65–99)

## 2016-10-26 MED ORDER — AMOXICILLIN-POT CLAVULANATE 875-125 MG PO TABS: 1.0000 | ORAL_TABLET | Freq: Two times a day (BID) | ORAL | 0 refills | Status: AC

## 2016-10-26 MED ORDER — TRAMADOL HCL 50 MG PO TABS: 50.0000 mg | ORAL_TABLET | Freq: Four times a day (QID) | ORAL | 0 refills | Status: DC | PRN

## 2016-10-26 MED ORDER — NICOTINE 14 MG/24HR TD PT24: 14.0000 mg | MEDICATED_PATCH | Freq: Every day | TRANSDERMAL | 0 refills | Status: DC

## 2016-10-26 MED ORDER — GUAIFENESIN-DM 100-10 MG/5ML PO SYRP: 5.0000 mL | ORAL_SOLUTION | ORAL | 0 refills | Status: DC | PRN

## 2016-10-26 MED ORDER — ALUM & MAG HYDROXIDE-SIMETH 200-200-20 MG/5ML PO SUSP: 15.0000 mL | Freq: Four times a day (QID) | ORAL | 0 refills | Status: DC | PRN

## 2016-10-26 NOTE — Discharge Instructions (Signed)
1. You may shower, wash incisions daily with soap and water 2. No driving for 2 weeks, or while using Narcotic pain medication 3. Call [phone] if you develop signs of wound infection (redness, drainage), fevers over 101.5  Thoracotomy, Care After This sheet gives you information about how to care for yourself after your procedure. Your health care provider may also give you more specific instructions. If you have problems or questions, contact your health care provider. What can I expect after the procedure? After your procedure, it is common to have:  Pain and swelling around the incision area.  Pain when you breathe in (inhale).  Constipation.  Fatigue.  Loss of appetite.  Trouble sleeping.  Mood swings and depression. Follow these instructions at home: Preventing pneumonia   Take deep breaths or do breathing exercises as instructed by your health care provider.  Cough frequently. Coughing may cause discomfort, but it is important to clear mucus (phlegm) and expand your lungs. If coughing hurts, hold a pillow against your chest or place both hands flat on top of the incision (splinting) when you cough. This may help relieve discomfort.  Continue to use an incentive spirometer as directed. This is a tool that measures how well you fill your lungs with each breath.  Participate in pulmonary rehabilitation as directed. This is a program that combines education, exercise, and support from a team of specialists. The goal is to help you heal and return to normal activities as soon as possible. Medicines   Take over-the-counter or prescription medicines only as told by your health care provider.  If you have pain, take pain-relieving medicine before your pain becomes severe. This is important because if your pain is under control, you will be able to breathe and cough more comfortably.  If you were prescribed an antibiotic medicine, take it as told by your health care provider.  Do not stop taking the antibiotic even if you start to feel better. Activity   Ask your health care provider what activities are safe for you.  Do not travel by airplane for 2 weeks after your chest tube is removed, or until your health care provider says that this is safe.  Do not lift anything that is heavier than 10 lb (4.5 kg), or the limit that your health care provider tells you, until he or she says that it is safe.  Do not drive until your health care provider approves.  Do not drive or use heavy machinery while taking prescription pain medicine. Incision care   Follow instructions from your health care provider about how to take care of your incision. Make sure you:  Wash your hands with soap and water before you change your bandage (dressing). If soap and water are not available, use hand sanitizer.  Change your dressing as told by your health care provider.  Leave stitches (sutures), skin glue, or adhesive strips in place. These skin closures may need to stay in place for 2 weeks or longer. If adhesive strip edges start to loosen and curl up, you may trim the loose edges. Do not remove adhesive strips completely unless your health care provider tells you to do that.  Keep your dressing dry.  Check your incision area every day for signs of infection. Check for:  More redness, swelling, or pain.  More fluid or blood.  Warmth.  Pus or a bad smell. Bathing   Do not take baths, swim, or use a hot tub until your health care provider  approves. You may take showers.  After your dressing has been removed, use soap and water to gently wash your incision area. Do not use anything else to clean your incision unless your health care provider tells you to do that. Eating and drinking   Eat a healthy diet as instructed by your health care provider. A healthy diet includes plenty of fresh fruits and vegetables, whole grains, and low-fat (lean) proteins.  Drink enough fluid to  keep your urine clear or pale yellow. General instructions   To prevent or treat constipation while you are taking prescription pain medicine, your health care provider may recommend that you:  Take over-the-counter or prescription medicines.  Eat foods that are high in fiber, such as fresh fruits and vegetables, whole grains, and beans.  Limit foods that are high in fat and processed sugars, such as fried and sweet foods.  Do not use any products that contain nicotine or tobacco, such as cigarettes and e-cigarettes. If you need help quitting, ask your health care provider.  Avoid secondhand smoke.  Wear compression stockings as told by your health care provider. These stockings help to prevent blood clots and reduce swelling in your legs.  If you have a chest tube, care for it as instructed.  Keep all follow-up visits as told by your health care provider. This is important. Contact a health care provider if:  You have more redness, swelling, or pain around your incision.  You have more fluid or blood coming from your incision.  Your incision feels warm to the touch.  You have pus or a bad smell coming from your incision.  You have a fever or chills.  Your heartbeat seems irregular.  You have nausea or vomiting.  You have muscle aches.  You are constipated. This may mean that you have:  Fewer bowel movements in a week than normal.  Difficulty having a bowel movement.  Stools that are dry, hard, or larger than normal. Get help right away if:  You develop a rash.  You feel light-headed or feel like you are going to faint.  You have shortness of breath or trouble breathing.  You are confused.  You have trouble speaking.  You have vision problems.  You are not able to move.  You have numbness in your face, arms, or legs.  You lose consciousness.  You have a sudden, severe headache.  You feel weak.  You have chest pain.  You have pain that:  Is  severe.  Gets worse, even with medicine. Summary  To prevent pneumonia, take deep breaths, do breathing exercises, and cough frequently, as instructed by your health care provider.  Do not drive until your health care provider approves. Do not travel by airplane for 2 weeks after your chest tube is removed, or until your health care provider approves.  Check your incision area every day for signs of infection.  Eat a healthy diet that includes plenty of fresh fruits and vegetables, whole grains, and low-fat (lean) proteins. This information is not intended to replace advice given to you by your health care provider. Make sure you discuss any questions you have with your health care provider. Document Released: 12/04/2010 Document Revised: 03/15/2016 Document Reviewed: 03/15/2016 Elsevier Interactive Patient Education  2017 [employer].

## 2016-10-26 NOTE — Progress Notes (Addendum)
      [address]       [place] [address]             [phone]      4 Days Post-Op Procedure(s) (LRB): VIDEO ASSISTED THORACOSCOPY (VATS)/THOROCOTOMY for Empyema  (Right)   Subjective:  Feels pretty good.  States he is going to stay with his mom near [place] after hospital discharge.   Objective: Vital signs in last 24 hours: Temp:  [98 F (36.7 C)-98.6 F (37 C)] 98 F (36.7 C) (04/24 0350) Pulse Rate:  [74-87] 77 (04/24 0350) Cardiac Rhythm: Normal sinus rhythm (04/23 1938) Resp:  [18-25] 21 (04/24 0350) BP: (116-128)/(70-81) 116/78 (04/24 0350) SpO2:  [92 %-100 %] 95 % (04/24 0350)  Intake/Output from previous day: 04/23 0701 - 04/24 0700 In: 820 [P.O.:720; IV Piggyback:100] Out: 1690 [Urine:1650; Chest Tube:40]  General appearance: alert, cooperative and no distress Heart: regular rate and rhythm Lungs: diminished breath sounds bibasilar Abdomen: soft, non-tender; bowel sounds normal; no masses,  no organomegaly Wound: clean and dry  Lab Results:  Recent Labs  10/24/16 0327 10/25/16 0505  WBC 9.5 11.1*  HGB 11.6* 12.3*  HCT 35.1* 36.5*  PLT 389 424*   BMET:  Recent Labs  10/24/16 0327 10/25/16 0505  NA 132* 131*  K 3.8 4.2  CL 97* 93*  CO2 28 27  GLUCOSE 97 108*  BUN 6 7  CREATININE 0.67 0.64  CALCIUM 8.3* 8.9    PT/INR: No results for input(s): LABPROT, INR in the last 72 hours. ABG    Component Value Date/Time   PHART 7.481 (H) 10/23/2016 0450   HCO3 25.7 10/23/2016 0450   O2SAT 99.4 10/23/2016 0450   CBG (last 3)   Recent Labs  10/25/16 1957 10/25/16 2342 10/26/16 0351  GLUCAP 106* 104* 92    Assessment/Plan: S/P Procedure(s) (LRB): VIDEO ASSISTED THORACOSCOPY (VATS)/THOROCOTOMY for Empyema  (Right)  1. Empyema- all chest tubes have been removed.  CXR is stable in appearance of pneumothorax/effusions... Mild increase in atelectasis, encouraged continued use of IS at discharge 2. ID- remains afebrile, no  leukocytosis, OR cultures are negative to date 3. Dispo- patient stable, can d/c home from our standpoint, continue oral ABX at discharge per medicines discretion.  Will f/u with patient in our office in 2 weeks   LOS: 7 days    Lowella Dandy 10/26/2016   Chart reviewed, patient examined, agree with above. CXR looks ok. He can go home from my point of view. Would treat him with outpt oral antibiotic for another week or so.

## 2016-10-26 NOTE — Progress Notes (Signed)
Patient oxygen saturation at rest is 93% on room air. Ambulated patient on room air and sats were 93-95%. Patient tolerated ambulation well.

## 2016-10-26 NOTE — Care Management Note (Signed)
Case Management Note  Patient Details  Name: ADRICK KESTLER MRN: [id number] Date of Birth: Apr 11, 1961  Subjective/Objective:      From home with room mate, pta indep.  POD 3 VATS, thorocotomy for empyema , removed remaining chest tube today, for cxr in am, DC PCA.  [facility] [place], he just had an apt last Monday.  He goes to [facility] in [place] to get his meds.  He will need medication ast at dc.  NCM will ast with Match Letter. He states he has transportation at discharge.    4/24 1156 Letha Cape RN, BSN - patient is for dc today, NCM gave patient Match Letter to help with medications.                              Action/Plan:   Expected Discharge Date:  10/26/16               Expected Discharge Plan:  Home/Self Care  In-House Referral:     Discharge planning Services  CM Consult, MATCH Program, [facility], Medication Assistance  Post Acute Care Choice:    Choice offered to:     DME Arranged:    DME Agency:     HH Arranged:    HH Agency:     Status of Service:  Completed, signed off  If discussed at [employer], dates discussed:    Additional Comments:  Leone Haven, RN 10/26/2016, 11:56 AM

## 2016-10-26 NOTE — Discharge Summary (Signed)
Physician Discharge Summary  Gerald Wheeler RUE:[id number] DOB: 03-26-1961 DOA: 10/19/2016  PCP: [name], FNP  Admit date: 10/19/2016 Discharge date: 10/26/2016  Admitted From: Home Disposition:  Home  Recommendations for Outpatient Follow-up:  1. Discharge home on Augmentin for 7 more days to complete 2 weeks course of antibiotic. 2. Follow-up with Dr. [name] in 2 weeks. 3. Patient will follow-up at adult and pediatric clinic in [facility].   Home Health: None Equipment/Devices: None  Discharge Condition: Fair CODE STATUS: Full code Diet recommendation: Low sodium/carb modified    Discharge Diagnoses:  Principal Problem:   Empyema lung (HCC)   Active Problems:   Parapneumonic effusion   Essential hypertension   Right lower lobe pneumonia (HCC)   Hyperlipidemia   Tobacco abuse   Diabetes mellitus type 2, noninsulin dependent (HCC)   Leukocytosis   Hyponatremia   Microcytic anemia   Acute respiratory failure with hypoxia (HCC)   Brief narrative/history of present illness 56 year old male with history of hypertension and diabetes resent it to the ED with progressive cough followed by dyspnea on exertion. In the ED he was found to have right lobar pneumonia with large pleural effusion. Patient underwent thoracenteses with 500 mL fluid removed showing significant WBC. A CT of the chest was done which showed 80 as of localization. Patient seen by CT VS and underwent VATS on 4/20.  Hospital course  Principal Problem:   Right lower lobe pneumonia (HCC)   Empyema (HCC) Underwent VATS on 4/20. Chest tube placed a few days and removed on 4/23. Follow-up chest x-ray this morning showing a small apical pneumothorax and atelectasis. Culture from [place] are negative. Was discharged on oral Augmentin for 7 more days to complete 2 weeks of antibiotic course. -prescribe tramadol when necessary for pain. -Will follow-up with thoracic surgery (Dr. [name]) in 2 weeks.   Active  Problems:  Acute respiratory failure with hypoxia Secondary to pneumonia and empyema. Now improved.    Essential hypertension Stable. Continue amlodipine.    Tobacco abuse Counseled on cessation. Prescribed nicotine patch.    Diabetes mellitus type 2, noninsulin dependent (HCC) Continue metformin upon discharge.    Hyponatremia Secondary to pneumonia. improved.  Dyspepsia Continue when necessary Maalox .  Patient was seeing NP at adult med and pediatrics at [facility] (reports getting medications from health department). Recommended to follow-up over there in 1 week.   Family Communication  : None at bedside  Disposition Plan  : Home   Consults  : CTVS  Procedures  :  CT chest Thoracentesis VATS on 4/20   Discharge Instructions   Allergies as of 10/26/2016   No Known Allergies     Medication List    STOP taking these medications   benzonatate 100 MG capsule Commonly known as:  TESSALON   meloxicam 15 MG tablet Commonly known as:  MOBIC     TAKE these medications   alum & mag hydroxide-simeth 200-200-20 MG/5ML suspension Commonly known as:  MAALOX/MYLANTA Take 15 mLs by mouth every 6 (six) hours as needed for indigestion or heartburn.   amLODipine 5 MG tablet Commonly known as:  NORVASC Take 1 tablet (5 mg total) by mouth daily.   amoxicillin-clavulanate 875-125 MG tablet Commonly known as:  AUGMENTIN Take 1 tablet by mouth 2 (two) times daily.   gabapentin 400 MG capsule Commonly known as:  NEURONTIN Take 1 capsule (400 mg total) by mouth 3 (three) times daily.   guaiFENesin-dextromethorphan 100-10 MG/5ML syrup Commonly known as:  ROBITUSSIN DM Take 5 mLs by mouth every 4 (four) hours as needed for cough.   lisinopril 40 MG tablet Commonly known as:  PRINIVIL,ZESTRIL Take 1 tablet (40 mg total) by mouth daily.   loratadine 10 MG tablet Commonly known as:  CLARITIN Take 10 mg by mouth daily.   lovastatin 20 MG  tablet Commonly known as:  MEVACOR Take 20 mg by mouth at bedtime.   metFORMIN 500 MG tablet Commonly known as:  GLUCOPHAGE Take 1 tablet (500 mg total) by mouth 2 (two) times daily with a meal.   metoprolol tartrate 25 MG tablet Commonly known as:  LOPRESSOR Take 25 mg by mouth daily.   nicotine 14 mg/24hr patch Commonly known as:  NICODERM CQ - dosed in mg/24 hours Place 1 patch (14 mg total) onto the skin daily.   traMADol 50 MG tablet Commonly known as:  ULTRAM Take 1 tablet (50 mg total) by mouth every 6 (six) hours as needed (mild pain).      Follow-up Information    [facility] Follow up.   Specialty:  Pediatrics Why:  please make a hospital follow up apt Contact information: [address] [place] [address] [phone]        Alleen Borne, MD Follow up.   Specialty:  Cardiothoracic Surgery Contact information: [address] [place] [address] [place] [address] [phone]          No Known Allergies    Procedures/Studies: Dg Chest 2 View  Result Date: 10/26/2016 CLINICAL DATA:  Right chest pain EXAM: CHEST  2 VIEW COMPARISON:  Yesterday FINDINGS: Interval removal of right-sided chest tube. No change in small right apical pneumothorax, 5% or less. Mildly increased atelectasis bilaterally. Mild pleural thickening along the right lower and lateral chest wall. Normal heart size and mediastinal contours. Trace pleural effusions. IMPRESSION: 1. Unchanged small right apical pneumothorax after chest tube removal. 2. Mildly increased atelectasis.  Trace pleural effusions. Electronically Signed   By: Marnee Spring M.D.   On: 10/26/2016 07:37   Dg Chest 2 View  Result Date: 10/20/2016 CLINICAL DATA:  Post right thoracentesis EXAM: CHEST  2 VIEW COMPARISON:  10/19/2016 FINDINGS: Moderate right pleural effusion post thoracentesis is unchanged from yesterday. No pneumothorax. Right lower lobe atelectasis/ infiltrate  unchanged. Mild left lower lobe atelectasis.  Negative for heart failure IMPRESSION: Negative for pneumothorax post right thoracentesis. No change in moderate right pleural effusion and right lower lobe airspace disease. Electronically Signed   By: Marlan Palau M.D.   On: 10/20/2016 10:52   Dg Chest 2 View  Result Date: 10/19/2016 CLINICAL DATA:  Cough, congestion, shortness of breath EXAM: CHEST  2 VIEW COMPARISON:  01/25/2016 FINDINGS: New small to moderate right pleural effusion with right lower lung collapse/consolidation. Appearance is compatible with right basilar pneumonia and parapneumonic effusion. Minor left base atelectasis. Upper lobes remain clear. Normal heart size and vascularity. Trachea is midline. Normal bowel gas pattern. Minor thoracic spondylosis. IMPRESSION: Right lower lung consolidation and small to moderate right pleural effusion compatible with pneumonia, as above. Recommend radiographic follow-up to document resolution. Electronically Signed   By: Judie Petit.  Shick M.D.   On: 10/19/2016 13:28   Ct Chest W Contrast  Result Date: 10/20/2016 CLINICAL DATA:  Pneumonia.  Effusion.  Thoracentesis. EXAM: CT CHEST WITH CONTRAST TECHNIQUE: Multidetector CT imaging of the chest was performed during intravenous contrast administration. CONTRAST:  75mL ISOVUE-300 IOPAMIDOL (ISOVUE-300) INJECTION 61% COMPARISON:  Radiography same  day.  Radiography 10/19/2016 FINDINGS: Cardiovascular: Aortic atherosclerosis. No aneurysm or dissection. Extensive coronary artery calcification. Small amount of pericardial fluid. Pulmonary arteries appear unremarkable. Mediastinum/Nodes: No mediastinal mass or lymphadenopathy. Lungs/Pleura: There is mild atelectasis at the left lung base. The left lung is otherwise clear. On the right, there is pleural fluid which largely layers dependently but shows some loculation. There is complete collapse of the right lower lobe and near complete collapse of the right middle lobe.  Right upper lobe remains largely inflated. Both lungs show minimal areas of emphysema at the apices. Upper Abdomen: Negative Musculoskeletal: Normal IMPRESSION: Right pleural effusion, largely dependent but with some areas of loculation. Complete collapse of the right lower lobe and right middle lobe. Right upper lobe almost completely inflated. Dependent atelectasis at the left lung base. No pleural effusion seen on the left. Aortic atherosclerosis. Extensive coronary artery calcification. Small amount of pericardial fluid. Electronically Signed   By: Paulina Fusi M.D.   On: 10/20/2016 21:31   Dg Chest Port 1 View  Result Date: 10/25/2016 CLINICAL DATA:  Chest tube in place.  Diabetes. EXAM: PORTABLE CHEST 1 VIEW COMPARISON:  10/24/2016 FINDINGS: Right internal jugular line tip at low SVC. 1 of 2 right-sided chest tubes has been removed. Persistent subcutaneous emphysema about the right hemithorax. 5% right apical pneumothorax is unchanged. Midline trachea. Borderline cardiomegaly. No pleural fluid. Slight improvement in mild bibasilar atelectasis. IMPRESSION: Removal of 1 of 2 right chest tubes with similar 5% right apical pneumothorax. Improved inspiratory effort with persistent bibasilar atelectasis. Electronically Signed   By: Jeronimo Greaves M.D.   On: 10/25/2016 07:52   Dg Chest Port 1 View  Result Date: 10/24/2016 CLINICAL DATA:  Patient with right-sided chest tube. EXAM: PORTABLE CHEST 1 VIEW COMPARISON:  Chest radiograph 10/23/2016. FINDINGS: Right IJ central venous catheter tip projects over the superior cavoatrial junction. Two right chest tubes remain in place. Monitoring leads overlie the patient. Stable cardiac and mediastinal contours. Interval increase in size of moderate left pleural effusion with underlying heterogeneous opacities. Persistent small right pleural effusion with underlying heterogeneous opacities. Probable tiny right apical pneumothorax. IMPRESSION: Right chest tubes remain in  place. Probable tiny right apical pneumothorax. Persistent small right pleural effusion and underlying opacities. Slight interval increase in size of moderate left pleural effusion and underlying heterogeneous opacities. Electronically Signed   By: Annia Belt M.D.   On: 10/24/2016 07:56   Dg Chest Port 1 View  Result Date: 10/23/2016 CLINICAL DATA:  Patient with history of empyema. Right chest tube in place. EXAM: PORTABLE CHEST 1 VIEW COMPARISON:  Chest radiograph 10/22/2016 FINDINGS: Monitoring leads overlie the patient. Right IJ central venous catheter tip projects over the superior cavoatrial junction. Two right-sided chest tubes remain in place. Stable cardiac and mediastinal contours. Low lung volumes. Right-greater-than-left basilar heterogeneous opacities. Persistent right pleural fluid/ thickening. Small left pleural fluid/ thickening. New small right pneumothorax. IMPRESSION: New small right pneumothorax.  Right chest tubes remain in position. Stable support apparatus. Persistent right pleural thickening/ fluid with associated heterogeneous opacities at the lung base which may represent atelectasis or infection. Critical Value/emergent results were called by telephone at the time of interpretation on 10/23/2016 at 8:13 am to [facility], who verbally acknowledged these results. Electronically Signed   By: Annia Belt M.D.   On: 10/23/2016 08:14   Dg Chest Port 1 View  Result Date: 10/22/2016 CLINICAL DATA:  Post op central line Lt chest tube. Empyema EXAM: PORTABLE CHEST 1 VIEW COMPARISON:  CT and chest radiograph, 10/20/2016 FINDINGS: Since prior studies, 2 right-sided chest tubes have been inserted. Both have their tips at the right apex. The majority of the right pleural fluid has been evacuated. There is residual discoid and linear opacity at the right lung base consistent with atelectasis. Remainder the right lung is clear. No right pneumothorax. Mild linear left lung base opacity is noted,  stable, consistent with atelectasis. Left lung otherwise clear. No left pleural effusion. New right internal jugular central venous line has its tip in the lower superior vena cava. IMPRESSION: 1. Right internal jugular central venous line tip projects in the lower superior vena cava approximately 3 cm above the caval atrial junction. 2. Two right chest tubes have their tips at the right apex. No pneumothorax. There has been significant improvement in right lung aeration with near complete evacuation of the right pleural fluid and significant improvement in right lung base atelectasis. Electronically Signed   By: Amie Portland M.D.   On: 10/22/2016 10:26   US Thoracentesis Asp Pleural Space W/img Guide  Result Date: 10/20/2016 INDICATION: Respiratory failure secondary to community acquired right lower lobe pneumonia with pleural effusion. Request is made for diagnostic and therapeutic thoracentesis. EXAM: ULTRASOUND GUIDED DIAGNOSTIC AND THERAPEUTIC THORACENTESIS MEDICATIONS: 1% lidocaine COMPLICATIONS: None immediate. PROCEDURE: An ultrasound guided thoracentesis was thoroughly discussed with the patient and questions answered. The benefits, risks, alternatives and complications were also discussed. The patient understands and wishes to proceed with the procedure. Written consent was obtained. Ultrasound was performed to localize and mark an adequate pocket of fluid in the right chest. The area was then prepped and draped in the normal sterile fashion. 1% Lidocaine was used for local anesthesia. Under ultrasound guidance a Safe-T-Centesis catheter was introduced. Thoracentesis was performed. The catheter was removed and a dressing applied. FINDINGS: A total of approximately 0.5 L of slightly cloudy yellow fluid was removed. There did appear to be a small loculation noted within this very small amount of fluid. Samples were sent to the laboratory as requested by the clinical team. IMPRESSION: Successful  ultrasound guided right thoracentesis yielding 0.5 L of pleural fluid. Read by: Barnetta Chapel, PA-C Electronically Signed   By: Gilmer Mor D.O.   On: 10/20/2016 13:52       Subjective: Pain over right chest tube site better.  Discharge Exam: Vitals:   10/26/16 0350 10/26/16 0843  BP: 116/78   Pulse: 77   Resp: (!) 21   Temp: 98 F (36.7 C) 97.8 F (36.6 C)   Vitals:   10/25/16 1953 10/25/16 2345 10/26/16 0350 10/26/16 0843  BP:   116/78   Pulse: 87 85 77   Resp: (!) 25 (!) 23 (!) 21   Temp: 98.1 F (36.7 C) 98.6 F (37 C) 98 F (36.7 C) 97.8 F (36.6 C)  TempSrc: Oral Oral Oral Oral  SpO2: 93% 93% 95%   Weight:      Height:        Gen: not in distress HEENT: moist mucosa, supple neck Chest:Improved breath sounds over bilateral lung. Clean Dressing lower chest tube removal site  CVS: N S1&S2, no murmurs GI: soft, NT, ND, Musculoskeletal: warm, no edema     The results of significant diagnostics from this hospitalization (including imaging, microbiology, ancillary and laboratory) are listed below for reference.     Microbiology: Recent Results (from the past 240 hour(s))  Culture, blood (routine x 2) Call MD if unable to obtain prior to antibiotics being given  Status: None   Collection Time: 10/19/16  7:42 PM  Result Value Ref Range Status   Specimen Description BLOOD BLOOD LEFT ARM  Final   Special Requests   Final    BOTTLES DRAWN AEROBIC ONLY Blood Culture adequate volume   Culture   Final    NO GROWTH 5 DAYS Performed at [facility], [address] [place], [place] [address]    Report Status 10/24/2016 FINAL  Final  Culture, blood (routine x 2) Call MD if unable to obtain prior to antibiotics being given     Status: None   Collection Time: 10/19/16  7:45 PM  Result Value Ref Range Status   Specimen Description BLOOD BLOOD LEFT ARM  Final   Special Requests   Final    BOTTLES DRAWN AEROBIC ONLY Blood Culture adequate volume   Culture    Final    NO GROWTH 5 DAYS Performed at [facility], [address], [place], [place] [address]    Report Status 10/24/2016 FINAL  Final  Respiratory Panel by PCR     Status: None   Collection Time: 10/20/16  7:34 AM  Result Value Ref Range Status   Adenovirus NOT DETECTED NOT DETECTED Final   Coronavirus 229E NOT DETECTED NOT DETECTED Final   Coronavirus HKU1 NOT DETECTED NOT DETECTED Final   Coronavirus NL63 NOT DETECTED NOT DETECTED Final   Coronavirus OC43 NOT DETECTED NOT DETECTED Final   Metapneumovirus NOT DETECTED NOT DETECTED Final   Rhinovirus / Enterovirus NOT DETECTED NOT DETECTED Final   Influenza A NOT DETECTED NOT DETECTED Final   Influenza B NOT DETECTED NOT DETECTED Final   Parainfluenza Virus 1 NOT DETECTED NOT DETECTED Final   Parainfluenza Virus 2 NOT DETECTED NOT DETECTED Final   Parainfluenza Virus 3 NOT DETECTED NOT DETECTED Final   Parainfluenza Virus 4 NOT DETECTED NOT DETECTED Final   Respiratory Syncytial Virus NOT DETECTED NOT DETECTED Final   Bordetella pertussis NOT DETECTED NOT DETECTED Final   Chlamydophila pneumoniae NOT DETECTED NOT DETECTED Final   Mycoplasma pneumoniae NOT DETECTED NOT DETECTED Final    Comment: Performed at [facility], [address], [place], [place] [address]  Culture, body fluid-bottle     Status: None   Collection Time: 10/20/16  1:01 PM  Result Value Ref Range Status   Specimen Description FLUID RIGHT PLEURAL  Final   Special Requests NONE  Final   Culture   Final    NO GROWTH 5 DAYS Performed at [facility], [address], [place], [place] [address]    Report Status 10/25/2016 FINAL  Final  Gram stain     Status: None   Collection Time: 10/20/16  1:01 PM  Result Value Ref Range Status   Specimen Description FLUID RIGHT PLEURAL  Final   Special Requests NONE  Final   Gram Stain   Final    ABUNDANT WBC PRESENT,BOTH PMN AND MONONUCLEAR NO ORGANISMS SEEN Performed at [facility],  [address], [place], [place] [address]    Report Status 10/20/2016 FINAL  Final  Culture, sputum-assessment     Status: None   Collection Time: 10/20/16  4:09 PM  Result Value Ref Range Status   Specimen Description SPUTUM  Final   Special Requests NONE  Final   Sputum evaluation THIS SPECIMEN IS ACCEPTABLE FOR SPUTUM CULTURE  Final   Report Status 10/20/2016 FINAL  Final  Culture, respiratory (NON-Expectorated)  Status: None   Collection Time: 10/20/16  4:09 PM  Result Value Ref Range Status   Specimen Description SPUTUM  Final   Special Requests NONE Reflexed from [id number]  Final   Gram Stain   Final    RARE WBC PRESENT, PREDOMINANTLY PMN RARE GRAM POSITIVE COCCI IN PAIRS RARE GRAM POSITIVE RODS    Culture   Final    Consistent with normal respiratory flora. Performed at [facility], [address] [place], [place] [address]    Report Status 10/23/2016 FINAL  Final  Culture, body fluid-bottle     Status: None (Preliminary result)   Collection Time: 10/22/16  8:36 AM  Result Value Ref Range Status   Specimen Description PLEURAL RIGHT  Final   Special Requests NONE  Final   Culture NO GROWTH 3 DAYS  Final   Report Status PENDING  Incomplete  Gram stain     Status: None   Collection Time: 10/22/16  8:36 AM  Result Value Ref Range Status   Specimen Description PLEURAL RIGHT  Final   Special Requests NONE  Final   Gram Stain   Final    MODERATE WBC PRESENT,BOTH PMN AND MONONUCLEAR NO ORGANISMS SEEN    Report Status 10/22/2016 FINAL  Final  Anaerobic culture     Status: None (Preliminary result)   Collection Time: 10/22/16  8:55 AM  Result Value Ref Range Status   Specimen Description TISSUE RIGHT PLEURAL  Final   Special Requests PEEL  Final   Culture   Final    NO ANAEROBES ISOLATED; CULTURE IN PROGRESS FOR 5 DAYS   Report Status PENDING  Incomplete  Aerobic Culture (superficial specimen)     Status: None   Collection Time: 10/22/16  8:55 AM  Result Value  Ref Range Status   Specimen Description TISSUE RIGHT PLEURAL  Final   Special Requests PEEL  Final   Gram Stain   Final    MODERATE WBC PRESENT, PREDOMINANTLY PMN NO ORGANISMS SEEN    Culture NO GROWTH 3 DAYS  Final   Report Status 10/25/2016 FINAL  Final  MRSA PCR Screening     Status: None   Collection Time: 10/23/16  2:16 AM  Result Value Ref Range Status   MRSA by PCR NEGATIVE NEGATIVE Final    Comment:        The GeneXpert MRSA Assay (FDA approved for NASAL specimens only), is one component of a comprehensive MRSA colonization surveillance program. It is not intended to diagnose MRSA infection nor to guide or monitor treatment for MRSA infections.      Labs: BNP (last 3 results)  Recent Labs  01/25/16 1805  BNP 12.8   Basic Metabolic Panel:  Recent Labs Lab 10/19/16 1258 10/20/16 0623 10/23/16 0505 10/24/16 0327 10/25/16 0505  NA 132* 134* 134* 132* 131*  K 4.5 3.8 4.2 3.8 4.2  CL 99* 102 100* 97* 93*  CO2 23 21* GLUCOSE 118* 124* 106* 97 108*  BUN CREATININE 0.79 0.69 0.64 0.67 0.64  CALCIUM 9.2 8.8* 8.1* 8.3* 8.9  MG  --  1.9  --   --   --   PHOS  --  3.1  --   --   --    Liver Function Tests:  Recent Labs Lab 10/19/16 1258 10/20/16 0623 10/23/16 0505 10/24/16 0327  AST 24 13* 21 17  ALT 20 14* 42 34  ALKPHOS 59 54 45  49  BILITOT 0.4 0.6 0.5 0.4  PROT 7.6 7.5 5.7* 6.0*  ALBUMIN 3.3* 3.0* 2.0* 2.0*    Recent Labs Lab 10/19/16 1258  LIPASE 13   No results for input(s): AMMONIA in the last 168 hours. CBC:  Recent Labs Lab 10/19/16 1258 10/20/16 0623 10/23/16 0505 10/24/16 0327 10/25/16 0505  WBC 11.4* 13.3* 11.9* 9.5 11.1*  NEUTROABS  --  12.0*  --   --   --   HGB 12.9* 12.0* 10.7* 11.6* 12.3*  HCT 38.6* 36.9* 32.9* 35.1* 36.5*  MCV 71.5* 72.9* 72.0* 71.5* 70.7*  PLT 346 342 371 389 424*   Cardiac Enzymes:  Recent Labs Lab 10/19/16 1258  TROPONINI <0.03   BNP: Invalid input(s):  POCBNP CBG:  Recent Labs Lab 10/25/16 1745 10/25/16 1957 10/25/16 2342 10/26/16 0351 10/26/16 0841  GLUCAP 113* 106* 104* 92 81   D-Dimer No results for input(s): DDIMER in the last 72 hours. Hgb A1c No results for input(s): HGBA1C in the last 72 hours. Lipid Profile No results for input(s): CHOL, HDL, LDLCALC, TRIG, CHOLHDL, LDLDIRECT in the last 72 hours. Thyroid function studies No results for input(s): TSH, T4TOTAL, T3FREE, THYROIDAB in the last 72 hours.  Invalid input(s): FREET3 Anemia work up No results for input(s): VITAMINB12, FOLATE, FERRITIN, TIBC, IRON, RETICCTPCT in the last 72 hours. Urinalysis    Component Value Date/Time   COLORURINE YELLOW 10/20/2016 0035   APPEARANCEUR CLEAR 10/20/2016 0035   LABSPEC 1.011 10/20/2016 0035   PHURINE 6.0 10/20/2016 0035   GLUCOSEU NEGATIVE 10/20/2016 0035   HGBUR NEGATIVE 10/20/2016 0035   BILIRUBINUR NEGATIVE 10/20/2016 0035   KETONESUR 5 (A) 10/20/2016 0035   PROTEINUR NEGATIVE 10/20/2016 0035   NITRITE NEGATIVE 10/20/2016 0035   LEUKOCYTESUR NEGATIVE 10/20/2016 0035   Sepsis Labs Invalid input(s): PROCALCITONIN,  WBC,  LACTICIDVEN Microbiology Recent Results (from the past 240 hour(s))  Culture, blood (routine x 2) Call MD if unable to obtain prior to antibiotics being given     Status: None   Collection Time: 10/19/16  7:42 PM  Result Value Ref Range Status   Specimen Description BLOOD BLOOD LEFT ARM  Final   Special Requests   Final    BOTTLES DRAWN AEROBIC ONLY Blood Culture adequate volume   Culture   Final    NO GROWTH 5 DAYS Performed at [facility], [address] [place], [place] [address]    Report Status 10/24/2016 FINAL  Final  Culture, blood (routine x 2) Call MD if unable to obtain prior to antibiotics being given     Status: None   Collection Time: 10/19/16  7:45 PM  Result Value Ref Range Status   Specimen Description BLOOD BLOOD LEFT ARM  Final   Special Requests   Final    BOTTLES  DRAWN AEROBIC ONLY Blood Culture adequate volume   Culture   Final    NO GROWTH 5 DAYS Performed at [facility], [address], [place], [place] [address]    Report Status 10/24/2016 FINAL  Final  Respiratory Panel by PCR     Status: None   Collection Time: 10/20/16  7:34 AM  Result Value Ref Range Status   Adenovirus NOT DETECTED NOT DETECTED Final   Coronavirus 229E NOT DETECTED NOT DETECTED Final   Coronavirus HKU1 NOT DETECTED NOT DETECTED Final   Coronavirus NL63 NOT DETECTED NOT DETECTED Final   Coronavirus OC43 NOT DETECTED NOT DETECTED Final   Metapneumovirus NOT DETECTED NOT DETECTED Final  Rhinovirus / Enterovirus NOT DETECTED NOT DETECTED Final   Influenza A NOT DETECTED NOT DETECTED Final   Influenza B NOT DETECTED NOT DETECTED Final   Parainfluenza Virus 1 NOT DETECTED NOT DETECTED Final   Parainfluenza Virus 2 NOT DETECTED NOT DETECTED Final   Parainfluenza Virus 3 NOT DETECTED NOT DETECTED Final   Parainfluenza Virus 4 NOT DETECTED NOT DETECTED Final   Respiratory Syncytial Virus NOT DETECTED NOT DETECTED Final   Bordetella pertussis NOT DETECTED NOT DETECTED Final   Chlamydophila pneumoniae NOT DETECTED NOT DETECTED Final   Mycoplasma pneumoniae NOT DETECTED NOT DETECTED Final    Comment: Performed at [facility], [address], [place], [place] [address]  Culture, body fluid-bottle     Status: None   Collection Time: 10/20/16  1:01 PM  Result Value Ref Range Status   Specimen Description FLUID RIGHT PLEURAL  Final   Special Requests NONE  Final   Culture   Final    NO GROWTH 5 DAYS Performed at [facility], [address], [place], [place] [address]    Report Status 10/25/2016 FINAL  Final  Gram stain     Status: None   Collection Time: 10/20/16  1:01 PM  Result Value Ref Range Status   Specimen Description FLUID RIGHT PLEURAL  Final   Special Requests NONE  Final   Gram Stain   Final    ABUNDANT WBC PRESENT,BOTH PMN AND  MONONUCLEAR NO ORGANISMS SEEN Performed at [facility], [address], [place], [place] [address]    Report Status 10/20/2016 FINAL  Final  Culture, sputum-assessment     Status: None   Collection Time: 10/20/16  4:09 PM  Result Value Ref Range Status   Specimen Description SPUTUM  Final   Special Requests NONE  Final   Sputum evaluation THIS SPECIMEN IS ACCEPTABLE FOR SPUTUM CULTURE  Final   Report Status 10/20/2016 FINAL  Final  Culture, respiratory (NON-Expectorated)     Status: None   Collection Time: 10/20/16  4:09 PM  Result Value Ref Range Status   Specimen Description SPUTUM  Final   Special Requests NONE Reflexed from [id number]  Final   Gram Stain   Final    RARE WBC PRESENT, PREDOMINANTLY PMN RARE GRAM POSITIVE COCCI IN PAIRS RARE GRAM POSITIVE RODS    Culture   Final    Consistent with normal respiratory flora. Performed at [facility], [address], [place], [place] [address]    Report Status 10/23/2016 FINAL  Final  Culture, body fluid-bottle     Status: None (Preliminary result)   Collection Time: 10/22/16  8:36 AM  Result Value Ref Range Status   Specimen Description PLEURAL RIGHT  Final   Special Requests NONE  Final   Culture NO GROWTH 3 DAYS  Final   Report Status PENDING  Incomplete  Gram stain     Status: None   Collection Time: 10/22/16  8:36 AM  Result Value Ref Range Status   Specimen Description PLEURAL RIGHT  Final   Special Requests NONE  Final   Gram Stain   Final    MODERATE WBC PRESENT,BOTH PMN AND MONONUCLEAR NO ORGANISMS SEEN    Report Status 10/22/2016 FINAL  Final  Anaerobic culture     Status: None (Preliminary result)   Collection Time: 10/22/16  8:55 AM  Result Value Ref Range Status   Specimen Description TISSUE RIGHT PLEURAL  Final   Special Requests PEEL  Final   Culture   Final    NO ANAEROBES ISOLATED; CULTURE IN PROGRESS FOR 5 DAYS   Report Status PENDING  Incomplete  Aerobic Culture (superficial specimen)      Status: None   Collection Time: 10/22/16  8:55 AM  Result Value Ref Range Status   Specimen Description TISSUE RIGHT PLEURAL  Final   Special Requests PEEL  Final   Gram Stain   Final    MODERATE WBC PRESENT, PREDOMINANTLY PMN NO ORGANISMS SEEN    Culture NO GROWTH 3 DAYS  Final   Report Status 10/25/2016 FINAL  Final  MRSA PCR Screening     Status: None   Collection Time: 10/23/16  2:16 AM  Result Value Ref Range Status   MRSA by PCR NEGATIVE NEGATIVE Final    Comment:        The GeneXpert MRSA Assay (FDA approved for NASAL specimens only), is one component of a comprehensive MRSA colonization surveillance program. It is not intended to diagnose MRSA infection nor to guide or monitor treatment for MRSA infections.      Time coordinating discharge: Over 30 minutes  SIGNED:   Eddie North, MD  [facility] 10/26/2016, 8:44 AM Pager   If 7PM-7AM, please contact night-coverage [email or web address] Password [email or web address]

## 2016-10-27 LAB — CULTURE, BODY FLUID W GRAM STAIN -BOTTLE: Culture: NO GROWTH

## 2016-10-27 LAB — ANAEROBIC CULTURE

## 2016-11-09 ENCOUNTER — Other Ambulatory Visit: Payer: Self-pay | Admitting: Surgery

## 2016-11-09 DIAGNOSIS — J869 Pyothorax without fistula: Secondary | ICD-10-CM

## 2016-11-12 ENCOUNTER — Ambulatory Visit: Payer: Self-pay | Admitting: Surgery

## 2016-11-17 ENCOUNTER — Ambulatory Visit
Admission: RE | Admit: 2016-11-17 | Discharge: 2016-11-17 | Disposition: A | Discharge status: Home / Self Care | Payer: No Typology Code available for payment source | Source: Ambulatory Visit | Attending: Cardiothoracic Surgery | Admitting: Cardiothoracic Surgery

## 2016-11-17 ENCOUNTER — Encounter: Payer: Self-pay | Admitting: Surgery

## 2016-11-17 ENCOUNTER — Ambulatory Visit (INDEPENDENT_AMBULATORY_CARE_PROVIDER_SITE_OTHER): Payer: Self-pay | Admitting: Surgery

## 2016-11-17 VITALS — BP 158/99 | Resp 16 | Ht 70.0 in | Wt 226.6 lb

## 2016-11-17 DIAGNOSIS — Z09 Encounter for follow-up examination after completed treatment for conditions other than malignant neoplasm: Secondary | ICD-10-CM

## 2016-11-17 DIAGNOSIS — J869 Pyothorax without fistula: Secondary | ICD-10-CM

## 2016-11-17 MED ORDER — TRAMADOL HCL 50 MG PO TABS: 50.0000 mg | ORAL_TABLET | Freq: Four times a day (QID) | ORAL | 0 refills | Status: DC | PRN

## 2016-11-18 ENCOUNTER — Encounter: Payer: Self-pay | Admitting: Surgery

## 2016-11-18 NOTE — Progress Notes (Signed)
HPI: Patient returns for routine postoperative follow-up having undergone right VATS, muscle-sparing thoracotomy, drainage of empyema and decortication of the right lung on 10/22/2016. The patient's early postoperative recovery while in the hospital was notable for an uncomplicated postop course. All of his preop and operative cultures were negative. He was sent home on a one week course of Augmentin to complete a two week course.  Since hospital discharge the patient reports that he has been feeling fairly well. He denies fever or chills. He is still smoking. He does not feel like his breathing is back to normal yet. He does report some symptoms that sound like he may be having reflux with burning in his chest and fluid coming up into the back of his throat when lying down that stimulates a cough. He says that he usually eats late before he goes to bed.   Current Outpatient Prescriptions  Medication Sig Dispense Refill  . alum & mag hydroxide-simeth (MAALOX/MYLANTA) 200-200-20 MG/5ML suspension Take 15 mLs by mouth every 6 (six) hours as needed for indigestion or heartburn. 355 mL 0  . amLODipine (NORVASC) 5 MG tablet Take 1 tablet (5 mg total) by mouth daily. 30 tablet 0  . gabapentin (NEURONTIN) 400 MG capsule Take 1 capsule (400 mg total) by mouth 3 (three) times daily. 120 capsule 0  . guaiFENesin-dextromethorphan (ROBITUSSIN DM) 100-10 MG/5ML syrup Take 5 mLs by mouth every 4 (four) hours as needed for cough. 118 mL 0  . lisinopril (PRINIVIL,ZESTRIL) 40 MG tablet Take 1 tablet (40 mg total) by mouth daily. 30 tablet 0  . loratadine (CLARITIN) 10 MG tablet Take 10 mg by mouth daily.    Marland Kitchen. lovastatin (MEVACOR) 20 MG tablet Take 20 mg by mouth at bedtime.    . metFORMIN (GLUCOPHAGE) 500 MG tablet Take 1 tablet (500 mg total) by mouth 2 (two) times daily with a meal. 30 tablet 0  . metoprolol tartrate (LOPRESSOR) 25 MG tablet Take 25 mg by mouth daily.    . nicotine (NICODERM CQ - DOSED IN  MG/24 HOURS) 14 mg/24hr patch Place 1 patch (14 mg total) onto the skin daily. 28 patch 0  . traMADol (ULTRAM) 50 MG tablet Take 1 tablet (50 mg total) by mouth every 6 (six) hours as needed (mild pain). 30 tablet 0  . traMADol (ULTRAM) 50 MG tablet Take 1 tablet (50 mg total) by mouth every 6 (six) hours as needed (mild pain). 30 tablet 0   No current facility-administered medications for this visit.     Physical Exam: BP (!) 158/99 (BP Location: Right Arm, Patient Position: Sitting, Cuff Size: Large)   Resp 16   Ht 5\' 10"  (1.778 m)   Wt 226 lb 9.6 oz (102.8 kg)   SpO2 99% Comment: ON RA  BMI 32.51 kg/m  He looks well. Lung exam shows decreased breath sounds at the right base. Cardiac exam shows a regular rate and rhythm with normal heart sounds. Chest incision is healing well and sternum is stable.   Diagnostic Tests:  CLINICAL DATA:  Empyema.  History of VATS 10/22/2016  EXAM: CHEST  2 VIEW  COMPARISON:  10/26/2016  FINDINGS: Small right pleural effusion has progressed from 10/26/2016, likely loculated laterally. No underlying lung opacity noted. Normal heart size and mediastinal contours. No osseous findings.  IMPRESSION: Increased but still small right pleural effusion with probable lateral loculation.   Electronically Signed   By: Marnee SpringJonathon  Watts M.D.   On: 11/17/2016 12:35  Impression:  He is making a satisfactory recovery after surgical drainage of an empyema. He still has a residual small loculated right pleural effusion but I expect that to resolve with time. I have strongly encouraged him to quit smoking. Reflux and aspiration pneumonia could certainly be the cause of this problem. I reviewed the medical management of reflux with him and discussed dietary, lifestyle and sleeping modifications that may help. If he continues to have this he may require further workup and treatment which could be directed by his PCP or a gastroenterologist. I asked him  not to do any heavy lifting of more than 10 lbs for 8 weeks postop.   Plan:  He will continue to follow up with his PCP. I will be happy to see him back if the need arises.   Alleen Borne, MD [facility] [phone]

## 2016-12-03 NOTE — Addendum Note (Signed)
Addendum  created 12/03/16 1337 by Clio Gerhart D, MD   Sign clinical note    

## 2017-07-05 ENCOUNTER — Other Ambulatory Visit: Payer: Self-pay

## 2017-07-05 ENCOUNTER — Emergency Department (HOSPITAL_BASED_OUTPATIENT_CLINIC_OR_DEPARTMENT_OTHER): Payer: Self-pay

## 2017-07-05 ENCOUNTER — Emergency Department (HOSPITAL_BASED_OUTPATIENT_CLINIC_OR_DEPARTMENT_OTHER)
Admission: EM | Admit: 2017-07-05 | Discharge: 2017-07-05 | Disposition: A | Discharge status: Home / Self Care | Payer: Self-pay | Attending: Emergency Medicine | Admitting: Emergency Medicine

## 2017-07-05 ENCOUNTER — Encounter (HOSPITAL_BASED_OUTPATIENT_CLINIC_OR_DEPARTMENT_OTHER): Payer: Self-pay | Admitting: *Deleted

## 2017-07-05 DIAGNOSIS — E119 Type 2 diabetes mellitus without complications: Secondary | ICD-10-CM | POA: Insufficient documentation

## 2017-07-05 DIAGNOSIS — F17210 Nicotine dependence, cigarettes, uncomplicated: Secondary | ICD-10-CM | POA: Insufficient documentation

## 2017-07-05 DIAGNOSIS — J45909 Unspecified asthma, uncomplicated: Secondary | ICD-10-CM | POA: Insufficient documentation

## 2017-07-05 DIAGNOSIS — M79642 Pain in left hand: Secondary | ICD-10-CM | POA: Insufficient documentation

## 2017-07-05 DIAGNOSIS — I10 Essential (primary) hypertension: Secondary | ICD-10-CM | POA: Insufficient documentation

## 2017-07-05 DIAGNOSIS — Z79899 Other long term (current) drug therapy: Secondary | ICD-10-CM | POA: Insufficient documentation

## 2017-07-05 DIAGNOSIS — Z7984 Long term (current) use of oral hypoglycemic drugs: Secondary | ICD-10-CM | POA: Insufficient documentation

## 2017-07-05 LAB — CBC WITH DIFFERENTIAL/PLATELET
BASOS ABS: 0 10*3/uL (ref 0.0–0.1)
BASOS PCT: 1 %
Eosinophils Absolute: 0 10*3/uL (ref 0.0–0.7)
Eosinophils Relative: 0 %
HCT: 39.9 % (ref 39.0–52.0)
HEMOGLOBIN: 13.5 g/dL (ref 13.0–17.0)
Lymphocytes Relative: 39 %
Lymphs Abs: 2.6 10*3/uL (ref 0.7–4.0)
MCH: 24.4 pg — ABNORMAL LOW (ref 26.0–34.0)
MCHC: 33.8 g/dL (ref 30.0–36.0)
MCV: 72.2 fL — ABNORMAL LOW (ref 78.0–100.0)
MONOS PCT: 7 %
Monocytes Absolute: 0.5 10*3/uL (ref 0.1–1.0)
NEUTROS PCT: 53 %
Neutro Abs: 3.5 10*3/uL (ref 1.7–7.7)
Platelets: 237 10*3/uL (ref 150–400)
RBC: 5.53 MIL/uL (ref 4.22–5.81)
RDW: 17.5 % — ABNORMAL HIGH (ref 11.5–15.5)
WBC: 6.7 10*3/uL (ref 4.0–10.5)

## 2017-07-05 LAB — BASIC METABOLIC PANEL
Anion gap: 15 (ref 5–15)
BUN: 10 mg/dL (ref 6–20)
CALCIUM: 8.7 mg/dL — AB (ref 8.9–10.3)
CHLORIDE: 100 mmol/L — AB (ref 101–111)
CO2: 19 mmol/L — AB (ref 22–32)
CREATININE: 0.86 mg/dL (ref 0.61–1.24)
GFR calc Af Amer: >60 mL/min (ref >60)
GFR calc non Af Amer: >60 mL/min (ref >60)
GLUCOSE: 77 mg/dL (ref 65–99)
Potassium: 4 mmol/L (ref 3.5–5.1)
Sodium: 134 mmol/L — ABNORMAL LOW (ref 135–145)

## 2017-07-05 MED ORDER — SODIUM CHLORIDE 0.9 % IV BOLUS (SEPSIS)
1000.0000 mL | Freq: Once | INTRAVENOUS | Status: AC
  Administered 2017-07-05: 1000 mL via INTRAVENOUS

## 2017-07-05 MED ORDER — IOPAMIDOL (ISOVUE-370) INJECTION 76%
100.0000 mL | Freq: Once | INTRAVENOUS | Status: AC | PRN
  Administered 2017-07-05: 100 mL via INTRAVENOUS

## 2017-07-05 NOTE — ED Triage Notes (Signed)
Pain in his left middle and ring fingers x 2 weeks. States his finger tips turn blue sometimes. He drank alcohol last night because his hand was hurting. Hx of diabetes.

## 2017-07-05 NOTE — ED Provider Notes (Signed)
[facility] EMERGENCY DEPARTMENT Provider Note   CSN: [id number] Arrival date & time: 07/05/17  1432     History   Chief Complaint Chief Complaint  Patient presents with  . Hand Pain    HPI Gerald Wheeler is a 57 y.o. male.  HPI  57 year old male presents with a chief complaint of hand pain.  Patient states that for the past 2 weeks his left ring and middle fingers have been painful.  Last night the pain was bad and woke him up in the middle the night despite him being drunk.  For the last couple months he has been waiting to get a vascular surgery referral at [facility] because his PCP has been unable to get blood pressures in his left arm while being able to get blood pressures in the right arm.  He has not had any imaging of this.  He is now starting to notice some blistering to his ring and middle finger of the left hand.  Some numbness at the tips of the fingers.  No weakness.  Past Medical History:  Diagnosis Date  . Allergy   . Arthritis   . Asthma   . Diabetes mellitus without complication (HCC)   . Essential hypertension 02/27/2016  . High cholesterol   . History of stroke   . Hypertension     Patient Active Problem List   Diagnosis Date Noted  . Community acquired pneumonia of right lower lobe of lung (HCC)   . Chest tube in place   . Empyema (HCC) 10/22/2016  . PNA (pneumonia) 10/19/2016  . Right lower lobe pneumonia (HCC) 10/19/2016  . Hyperlipidemia 10/19/2016  . Tobacco abuse 10/19/2016  . Diabetes mellitus type 2, noninsulin dependent (HCC) 10/19/2016  . Leukocytosis 10/19/2016  . Hyponatremia 10/19/2016  . Microcytic anemia 10/19/2016  . Acute respiratory failure with hypoxia (HCC) 10/19/2016  . Parapneumonic effusion 10/19/2016  . Essential hypertension 02/27/2016    Past Surgical History:  Procedure Laterality Date  . BACK SURGERY    . CYSTECTOMY N/A    cyst removed about 30 years ago  . TONSILLECTOMY  1973  . VIDEO ASSISTED  THORACOSCOPY (VATS)/THOROCOTOMY Right 10/22/2016   Procedure: VIDEO ASSISTED THORACOSCOPY (VATS)/THOROCOTOMY for Empyema ;  Surgeon: Alleen Borne, MD;  Location: [facility] OR;  Service: Thoracic;  Laterality: Right;       Home Medications    Prior to Admission medications   Medication Sig Start Date End Date Taking? Authorizing Provider  alum & mag hydroxide-simeth (MAALOX/MYLANTA) 200-200-20 MG/5ML suspension Take 15 mLs by mouth every 6 (six) hours as needed for indigestion or heartburn. 10/26/16   Dhungel, Nishant, MD  amLODipine (NORVASC) 5 MG tablet Take 1 tablet (5 mg total) by mouth daily. 02/27/16   Sharlene Dory, DO  gabapentin (NEURONTIN) 400 MG capsule Take 1 capsule (400 mg total) by mouth 3 (three) times daily. 10/28/15   Rolland Porter, MD  guaiFENesin-dextromethorphan (ROBITUSSIN DM) 100-10 MG/5ML syrup Take 5 mLs by mouth every 4 (four) hours as needed for cough. 10/26/16   Dhungel, Nishant, MD  lisinopril (PRINIVIL,ZESTRIL) 40 MG tablet Take 1 tablet (40 mg total) by mouth daily. 10/28/15   Rolland Porter, MD  loratadine (CLARITIN) 10 MG tablet Take 10 mg by mouth daily.    [provider]  lovastatin (MEVACOR) 20 MG tablet Take 20 mg by mouth at bedtime.    [provider]  metFORMIN (GLUCOPHAGE) 500 MG tablet Take 1 tablet (500 mg total) by  mouth 2 (two) times daily with a meal. 10/28/15   Rolland PorterJames, Mark, MD  metoprolol tartrate (LOPRESSOR) 25 MG tablet Take 25 mg by mouth daily.    [provider]  nicotine (NICODERM CQ - DOSED IN MG/24 HOURS) 14 mg/24hr patch Place 1 patch (14 mg total) onto the skin daily. 10/26/16   Dhungel, Theda BelfastNishant, MD  traMADol (ULTRAM) 50 MG tablet Take 1 tablet (50 mg total) by mouth every 6 (six) hours as needed (mild pain). 11/17/16   Alleen BorneBartle, Bryan K, MD  traMADol (ULTRAM) 50 MG tablet Take 1 tablet (50 mg total) by mouth every 6 (six) hours as needed (mild pain). 11/17/16   Alleen BorneBartle, Bryan K, MD    Family History Family History    Problem Relation Age of Onset  . Heart disease Mother   . High blood pressure Mother   . Heart disease Father   . Diabetes Father     Social History Social History   Tobacco Use  . Smoking status: Current Every Day Smoker    Types: Cigarettes  . Smokeless tobacco: Never Used  Substance Use Topics  . Alcohol use: Yes    Comment: weekly  . Drug use: No     Allergies   Patient has no known allergies.   Review of Systems Review of Systems  Constitutional: Negative for fever.  Respiratory: Negative for shortness of breath.   Cardiovascular: Negative for chest pain.  Musculoskeletal: Positive for arthralgias.  Neurological: Positive for numbness. Negative for weakness.  All other systems reviewed and are negative.    Physical Exam Updated Vital Signs BP 111/87   Pulse 88   Temp 98 F (36.7 C)   Resp 18   Ht 5' 11.5" (1.816 m)   Wt 102.1 kg (225 lb)   SpO2 99%   BMI 30.94 kg/m   Physical Exam  Constitutional: He is oriented to person, place, and time. He appears well-developed and well-nourished.  HENT:  Head: Normocephalic and atraumatic.  Right Ear: External ear normal.  Left Ear: External ear normal.  Nose: Nose normal.  Eyes: Right eye exhibits no discharge. Left eye exhibits no discharge.  Neck: Neck supple.  Cardiovascular: Normal rate, regular rhythm and normal heart sounds.  Pulses:      Radial pulses are 2+ on the right side, and 0 on the left side.  Unable to get ulnar pulses in either arm Pulses obtained by doppler in left arm for both radial and ulnar arteries  Pulmonary/Chest: Effort normal and breath sounds normal.  Abdominal: Soft. There is no tenderness.  Musculoskeletal: He exhibits no edema.  5/5 strength in BUE. Normal gross sensation.  Left ring, middle fingers are mildly red at palmar tips. Small areas of early skin breakdown. Left palmar hand appears mildly red, slightly more than right.  Neurological: He is alert and oriented to  person, place, and time.  Skin: Skin is warm and dry.  Nursing note and vitals reviewed.    ED Treatments / Results  Labs (all labs ordered are listed, but only abnormal results are displayed) Labs Reviewed  BASIC METABOLIC PANEL - Abnormal; Notable for the following components:      Result Value   Sodium 134 (*)    Chloride 100 (*)    CO2 19 (*)    Calcium 8.7 (*)    All other components within normal limits  CBC WITH DIFFERENTIAL/PLATELET - Abnormal; Notable for the following components:   MCV 72.2 (*)    [facility]  24.[facility] (*)    RDW 17.5 (*)    All other components within normal limits    EKG  EKG Interpretation None       Radiology Ct Angio Up Extrem Left W &/or Wo Contast  Result Date: 07/05/2017 CLINICAL DATA:  57 year old male with pain in the left middle and ring fingers x2 weeks with cyanotic finger tips at times. Diminished pulses and claudication. EXAM: CT ANGIOGRAPHY UPPER LEFT EXTREMITY<Procedure Description>CT ANGIOGRAPHY UPPER LEFT EXTREMITY TECHNIQUE: CTA of the left upper extremity from subclavian artery through distal radial and ulnar arteries. CONTRAST:  ISOVUE-370 IOPAMIDOL (ISOVUE-370) INJECTION 76%<Contrast>122mL ISOVUE-370 IOPAMIDOL (ISOVUE-370) INJECTION 76% COMPARISON:  None. FINDINGS: Patent included distal left subclavian, axillary, brachial, radial, interosseous and ulnar arteries without significant stenosis, vascular malformation or occlusion. The palmar arches, metacarpal and digital arteries of the hand are unremarkable specifically to the middle and ring fingers. Review of the MIP images confirms the above findings. IMPRESSION: No significant stenosis or occlusion noted as above. Electronically Signed   By: Tollie Eth M.D.   On: 07/05/2017 19:47    Procedures Procedures (including critical care time)  Medications Ordered in ED Medications  sodium chloride 0.9 % bolus 1,000 mL (0 mLs Intravenous Stopped 07/05/17 2024)  iopamidol (ISOVUE-370) 76 %  injection 100 mL (100 mLs Intravenous Contrast Given 07/05/17 1859)     Initial Impression / Assessment and Plan / ED Course  I have reviewed the triage vital signs and the nursing notes.  Pertinent labs & imaging results that were available during my care of the patient were reviewed by me and considered in my medical decision making (see chart for details).     No clear examination for the patient's fingertip numbness and pain.  While I cannot feel his radial artery, his hand feels warm and well-perfused and has normal cap refill.  The CT angiography shows no acute arterial dysfunction or clot.  Thus I believe he can continue his workup as an outpatient.  Discussed return precautions but he appears stable for discharge.  Final Clinical Impressions(s) / ED Diagnoses   Final diagnoses:  Left hand pain    ED Discharge Orders    None       Pricilla Loveless, MD 07/05/17 2353

## 2017-07-31 ENCOUNTER — Emergency Department (HOSPITAL_BASED_OUTPATIENT_CLINIC_OR_DEPARTMENT_OTHER)
Admission: EM | Admit: 2017-07-31 | Discharge: 2017-07-31 | Disposition: A | Discharge status: Home / Self Care | Payer: Self-pay | Attending: Emergency Medicine | Admitting: Emergency Medicine

## 2017-07-31 ENCOUNTER — Other Ambulatory Visit: Payer: Self-pay

## 2017-07-31 ENCOUNTER — Emergency Department (HOSPITAL_BASED_OUTPATIENT_CLINIC_OR_DEPARTMENT_OTHER): Payer: Self-pay

## 2017-07-31 ENCOUNTER — Encounter (HOSPITAL_BASED_OUTPATIENT_CLINIC_OR_DEPARTMENT_OTHER): Payer: Self-pay | Admitting: *Deleted

## 2017-07-31 DIAGNOSIS — J45909 Unspecified asthma, uncomplicated: Secondary | ICD-10-CM | POA: Insufficient documentation

## 2017-07-31 DIAGNOSIS — I10 Essential (primary) hypertension: Secondary | ICD-10-CM | POA: Insufficient documentation

## 2017-07-31 DIAGNOSIS — J069 Acute upper respiratory infection, unspecified: Secondary | ICD-10-CM | POA: Insufficient documentation

## 2017-07-31 DIAGNOSIS — Z8673 Personal history of transient ischemic attack (TIA), and cerebral infarction without residual deficits: Secondary | ICD-10-CM | POA: Insufficient documentation

## 2017-07-31 DIAGNOSIS — F17210 Nicotine dependence, cigarettes, uncomplicated: Secondary | ICD-10-CM | POA: Insufficient documentation

## 2017-07-31 DIAGNOSIS — Z79899 Other long term (current) drug therapy: Secondary | ICD-10-CM | POA: Insufficient documentation

## 2017-07-31 DIAGNOSIS — Z720 Tobacco use: Secondary | ICD-10-CM

## 2017-07-31 DIAGNOSIS — E119 Type 2 diabetes mellitus without complications: Secondary | ICD-10-CM | POA: Insufficient documentation

## 2017-07-31 DIAGNOSIS — Z7984 Long term (current) use of oral hypoglycemic drugs: Secondary | ICD-10-CM | POA: Insufficient documentation

## 2017-07-31 DIAGNOSIS — J4521 Mild intermittent asthma with (acute) exacerbation: Secondary | ICD-10-CM | POA: Insufficient documentation

## 2017-07-31 LAB — CBC WITH DIFFERENTIAL/PLATELET
BASOS ABS: 0 10*3/uL (ref 0.0–0.1)
Basophils Relative: 1 %
Eosinophils Absolute: 0 10*3/uL (ref 0.0–0.7)
Eosinophils Relative: 1 %
HEMATOCRIT: 42 % (ref 39.0–52.0)
Hemoglobin: 14.1 g/dL (ref 13.0–17.0)
LYMPHS PCT: 34 %
Lymphs Abs: 1.1 10*3/uL (ref 0.7–4.0)
MCH: 24.8 pg — ABNORMAL LOW (ref 26.0–34.0)
MCHC: 33.6 g/dL (ref 30.0–36.0)
MCV: 73.8 fL — AB (ref 78.0–100.0)
MONO ABS: 0.5 10*3/uL (ref 0.1–1.0)
MONOS PCT: 17 %
NEUTROS ABS: 1.5 10*3/uL — AB (ref 1.7–7.7)
Neutrophils Relative %: 47 %
Platelets: 184 10*3/uL (ref 150–400)
RBC: 5.69 MIL/uL (ref 4.22–5.81)
RDW: 17.9 % — ABNORMAL HIGH (ref 11.5–15.5)
WBC: 3.2 10*3/uL — ABNORMAL LOW (ref 4.0–10.5)

## 2017-07-31 LAB — BASIC METABOLIC PANEL
Anion gap: 11 (ref 5–15)
BUN: 13 mg/dL (ref 6–20)
CALCIUM: 8.6 mg/dL — AB (ref 8.9–10.3)
CO2: 20 mmol/L — AB (ref 22–32)
Chloride: 102 mmol/L (ref 101–111)
Creatinine, Ser: 0.89 mg/dL (ref 0.61–1.24)
GFR calc Af Amer: >60 mL/min (ref >60)
GFR calc non Af Amer: >60 mL/min (ref >60)
GLUCOSE: 91 mg/dL (ref 65–99)
Potassium: 3.9 mmol/L (ref 3.5–5.1)
Sodium: 133 mmol/L — ABNORMAL LOW (ref 135–145)

## 2017-07-31 MED ORDER — PREDNISONE 10 MG (21) PO TBPK: ORAL_TABLET | ORAL | 0 refills | Status: DC

## 2017-07-31 MED ORDER — ALBUTEROL (5 MG/ML) CONTINUOUS INHALATION SOLN
10.0000 mg/h | INHALATION_SOLUTION | RESPIRATORY_TRACT | Status: DC
  Administered 2017-07-31: 10 mg/h via RESPIRATORY_TRACT
  Filled 2017-07-31: qty 20

## 2017-07-31 MED ORDER — AEROCHAMBER PLUS FLO-VU MEDIUM MISC
1.0000 | Freq: Once | Status: AC
  Administered 2017-07-31: 1
  Filled 2017-07-31: qty 1

## 2017-07-31 MED ORDER — ALBUTEROL SULFATE (2.5 MG/3ML) 0.083% IN NEBU
5.0000 mg | INHALATION_SOLUTION | Freq: Once | RESPIRATORY_TRACT | Status: AC
  Administered 2017-07-31: 5 mg via RESPIRATORY_TRACT
  Filled 2017-07-31: qty 6

## 2017-07-31 MED ORDER — METHYLPREDNISOLONE SODIUM SUCC 125 MG IJ SOLR
125.0000 mg | Freq: Once | INTRAMUSCULAR | Status: AC
  Administered 2017-07-31: 125 mg via INTRAVENOUS
  Filled 2017-07-31: qty 2

## 2017-07-31 MED ORDER — ALBUTEROL SULFATE HFA 108 (90 BASE) MCG/ACT IN AERS
1.0000 | INHALATION_SPRAY | RESPIRATORY_TRACT | Status: DC | PRN
  Administered 2017-07-31: 2 via RESPIRATORY_TRACT
  Filled 2017-07-31: qty 6.7

## 2017-07-31 NOTE — ED Notes (Signed)
Pt and wife given d/c instructions as per chart. Rx x 1. Verbalizes understanding. No questions. 

## 2017-07-31 NOTE — ED Provider Notes (Signed)
[facility] EMERGENCY DEPARTMENT Provider Note   CSN: [id number] Arrival date & time: 07/31/17  1236     History   Chief Complaint Chief Complaint  Patient presents with  . Shortness of Breath    HPI Gerald Wheeler is a 57 y.o. male.  Pt presents to the ED today with sob.  The pt has used up almost his whole albuterol inhaler (no spacer).  He denies fevers.  He has not been around anyone who has been sick.      Past Medical History:  Diagnosis Date  . Allergy   . Arthritis   . Asthma   . Diabetes mellitus without complication (HCC)   . Essential hypertension 02/27/2016  . High cholesterol   . History of stroke   . Hypertension     Patient Active Problem List   Diagnosis Date Noted  . Community acquired pneumonia of right lower lobe of lung (HCC)   . Chest tube in place   . Empyema (HCC) 10/22/2016  . PNA (pneumonia) 10/19/2016  . Right lower lobe pneumonia (HCC) 10/19/2016  . Hyperlipidemia 10/19/2016  . Tobacco abuse 10/19/2016  . Diabetes mellitus type 2, noninsulin dependent (HCC) 10/19/2016  . Leukocytosis 10/19/2016  . Hyponatremia 10/19/2016  . Microcytic anemia 10/19/2016  . Acute respiratory failure with hypoxia (HCC) 10/19/2016  . Parapneumonic effusion 10/19/2016  . Essential hypertension 02/27/2016    Past Surgical History:  Procedure Laterality Date  . BACK SURGERY    . CYSTECTOMY N/A    cyst removed about 30 years ago  . TONSILLECTOMY  1973  . VIDEO ASSISTED THORACOSCOPY (VATS)/THOROCOTOMY Right 10/22/2016   Procedure: VIDEO ASSISTED THORACOSCOPY (VATS)/THOROCOTOMY for Empyema ;  Surgeon: Alleen BorneBryan K Bartle, MD;  [facility];  Service: Thoracic;  Laterality: Right;       Home Medications    Prior to Admission medications   Medication Sig Start Date End Date Taking? Authorizing Provider  amLODipine (NORVASC) 5 MG tablet Take 1 tablet (5 mg total) by mouth daily. 02/27/16  Yes Sharlene DoryWendling, Nicholas Paul, DO  gabapentin  (NEURONTIN) 400 MG capsule Take 1 capsule (400 mg total) by mouth 3 (three) times daily. 10/28/15  Yes Rolland PorterJames, Mark, MD  lisinopril (PRINIVIL,ZESTRIL) 40 MG tablet Take 1 tablet (40 mg total) by mouth daily. 10/28/15  Yes Rolland PorterJames, Mark, MD  lovastatin (MEVACOR) 20 MG tablet Take 20 mg by mouth at bedtime.   Yes [provider]  metoprolol tartrate (LOPRESSOR) 25 MG tablet Take 25 mg by mouth daily.   Yes [provider]  alum & mag hydroxide-simeth (MAALOX/MYLANTA) 200-200-20 MG/5ML suspension Take 15 mLs by mouth every 6 (six) hours as needed for indigestion or heartburn. 10/26/16   Dhungel, Nishant, MD  guaiFENesin-dextromethorphan (ROBITUSSIN DM) 100-10 MG/5ML syrup Take 5 mLs by mouth every 4 (four) hours as needed for cough. 10/26/16   Dhungel, Theda BelfastNishant, MD  loratadine (CLARITIN) 10 MG tablet Take 10 mg by mouth daily.    [provider]  metFORMIN (GLUCOPHAGE) 500 MG tablet Take 1 tablet (500 mg total) by mouth 2 (two) times daily with a meal. 10/28/15   Rolland PorterJames, Mark, MD  nicotine (NICODERM CQ - DOSED IN MG/24 HOURS) 14 mg/24hr patch Place 1 patch (14 mg total) onto the skin daily. 10/26/16   Dhungel, Nishant, MD  predniSONE (STERAPRED UNI-PAK 21 TAB) 10 MG (21) TBPK tablet Take 6 tabs by mouth daily  for 2 days, then 5 tabs for 2 days, then 4 tabs for 2  days, then 3 tabs for 2 days, 2 tabs for 2 days, then 1 tab by mouth daily for 2 days 07/31/17   Jacalyn Lefevre, MD  traMADol (ULTRAM) 50 MG tablet Take 1 tablet (50 mg total) by mouth every 6 (six) hours as needed (mild pain). 11/17/16   Alleen Borne, MD  traMADol (ULTRAM) 50 MG tablet Take 1 tablet (50 mg total) by mouth every 6 (six) hours as needed (mild pain). 11/17/16   Alleen Borne, MD    Family History Family History  Problem Relation Age of Onset  . Heart disease Mother   . High blood pressure Mother   . Heart disease Father   . Diabetes Father     Social History Social History   Tobacco Use  . Smoking  status: Current Every Day Smoker    Types: Cigarettes  . Smokeless tobacco: Never Used  Substance Use Topics  . Alcohol use: Yes    Comment: weekly  . Drug use: No     Allergies   Patient has no known allergies.   Review of Systems Review of Systems  Respiratory: Positive for cough, shortness of breath and wheezing.   All other systems reviewed and are negative.    Physical Exam Updated Vital Signs BP 96/76   Pulse 68   Temp 98.6 F (37 C) (Oral)   Resp 16   Wt 115.7 kg (255 lb)   SpO2 100%   BMI 35.07 kg/m   Physical Exam  Constitutional: He is oriented to person, place, and time. He appears well-developed and well-nourished.  HENT:  Head: Normocephalic and atraumatic.  Mouth/Throat: Oropharynx is clear and moist.  Eyes: EOM are normal. Pupils are equal, round, and reactive to light.  Neck: Normal range of motion. Neck supple.  Cardiovascular: Normal rate, regular rhythm and normal heart sounds.  Pulmonary/Chest: Tachypnea noted. He has wheezes.  Abdominal: Soft. Bowel sounds are normal.  Musculoskeletal: Normal range of motion.       Right lower leg: Normal.       Left lower leg: Normal.  Neurological: He is alert and oriented to person, place, and time.  Skin: Skin is warm and dry. Capillary refill takes less than 2 seconds.  Psychiatric: He has a normal mood and affect. His behavior is normal.  Nursing note and vitals reviewed.    ED Treatments / Results  Labs (all labs ordered are listed, but only abnormal results are displayed) Labs Reviewed  CBC WITH DIFFERENTIAL/PLATELET - Abnormal; Notable for the following components:      Result Value   WBC 3.2 (*)    MCV 73.8 (*)    MCH 24.8 (*)    RDW 17.9 (*)    Neutro Abs 1.5 (*)    All other components within normal limits  BASIC METABOLIC PANEL - Abnormal; Notable for the following components:   Sodium 133 (*)    CO2 20 (*)    Calcium 8.6 (*)    All other components within normal limits    EKG   EKG Interpretation  Date/Time:  Sunday July 31 2017 13:11:24 EST Ventricular Rate:  78 PR Interval:  130 QRS Duration: 84 QT Interval:  396 QTC Calculation: 451 R Axis:   9 Text Interpretation:  Normal sinus rhythm Nonspecific T wave abnormality Abnormal ECG Confirmed by Jacalyn Lefevre [phone]) on 07/31/2017 4:18:09 PM       Radiology Dg Chest 2 View  Result Date: 07/31/2017 CLINICAL DATA:  Cough and chills  shortness of breath. EXAM: CHEST  2 VIEW COMPARISON:  11/17/2016 FINDINGS: Stable scarring right peripheral base with decrease in right pleural effusion seen previously. Lungs otherwise clear. The cardiopericardial silhouette is within normal limits for size. Nodular density/densities projecting over the lungs are compatible with pads for telemetry leads. The visualized bony structures of the thorax are intact. IMPRESSION: Right basilar scarring versus chronic tiny right effusion. Otherwise unremarkable. Electronically Signed   By: Kennith Center M.D.   On: 07/31/2017 13:56    Procedures Procedures (including critical care time)  Medications Ordered in ED Medications  albuterol (PROVENTIL,VENTOLIN) solution continuous neb (10 mg/hr Nebulization New Bag/Given 07/31/17 1532)  albuterol (PROVENTIL HFA;VENTOLIN HFA) 108 (90 Base) MCG/ACT inhaler 1-2 puff (not administered)  AEROCHAMBER PLUS FLO-VU MEDIUM MISC 1 each (not administered)  albuterol (PROVENTIL) (2.5 MG/3ML) 0.083% nebulizer solution 5 mg (5 mg Nebulization Given 07/31/17 1310)  methylPREDNISolone sodium succinate (SOLU-MEDROL) 125 mg/2 mL injection 125 mg (125 mg Intravenous Given 07/31/17 1535)     Initial Impression / Assessment and Plan / ED Course  I have reviewed the triage vital signs and the nursing notes.  Pertinent labs & imaging results that were available during my care of the patient were reviewed by me and considered in my medical decision making (see chart for details).    Pt is feeling much better after  nebs and steroids.  CXR clear and afebrile.  Sats are good.  Pt encouraged to stop smoking.  He is diabetic and is told the steroids will raise his blood sugar.  He is given another albuterol inhaler and spacer.  He knows to return if worse.  Final Clinical Impressions(s) / ED Diagnoses   Final diagnoses:  Upper respiratory tract infection, unspecified type  Mild intermittent reactive airway disease with acute exacerbation  Tobacco abuse    ED Discharge Orders        Ordered    predniSONE (STERAPRED UNI-PAK 21 TAB) 10 MG (21) TBPK tablet     07/31/17 1650       Jacalyn Lefevre, MD 07/31/17 1652

## 2017-07-31 NOTE — ED Triage Notes (Signed)
PREsents with SOB, chest congestion and coughing, non productive, chills and bodyaches for the past few days. Bilateral inspiratory and expiratory wheezes noted.

## 2017-07-31 NOTE — Discharge Instructions (Signed)
Stop smoking.  The steroids will raise your blood sugar.  Pay strict attention to your diet and check your sugars regularly.

## 2018-08-29 ENCOUNTER — Encounter (HOSPITAL_BASED_OUTPATIENT_CLINIC_OR_DEPARTMENT_OTHER): Payer: Self-pay | Admitting: Emergency Medicine

## 2018-08-29 ENCOUNTER — Emergency Department (HOSPITAL_BASED_OUTPATIENT_CLINIC_OR_DEPARTMENT_OTHER): Payer: Self-pay

## 2018-08-29 ENCOUNTER — Emergency Department (HOSPITAL_BASED_OUTPATIENT_CLINIC_OR_DEPARTMENT_OTHER)
Admission: EM | Admit: 2018-08-29 | Discharge: 2018-08-29 | Disposition: A | Discharge status: Home / Self Care | Payer: Self-pay | Attending: Emergency Medicine | Admitting: Emergency Medicine

## 2018-08-29 ENCOUNTER — Other Ambulatory Visit: Payer: Self-pay

## 2018-08-29 DIAGNOSIS — M792 Neuralgia and neuritis, unspecified: Secondary | ICD-10-CM | POA: Insufficient documentation

## 2018-08-29 DIAGNOSIS — Z7984 Long term (current) use of oral hypoglycemic drugs: Secondary | ICD-10-CM | POA: Insufficient documentation

## 2018-08-29 DIAGNOSIS — Z8673 Personal history of transient ischemic attack (TIA), and cerebral infarction without residual deficits: Secondary | ICD-10-CM | POA: Insufficient documentation

## 2018-08-29 DIAGNOSIS — E119 Type 2 diabetes mellitus without complications: Secondary | ICD-10-CM | POA: Insufficient documentation

## 2018-08-29 DIAGNOSIS — F17200 Nicotine dependence, unspecified, uncomplicated: Secondary | ICD-10-CM | POA: Insufficient documentation

## 2018-08-29 DIAGNOSIS — I10 Essential (primary) hypertension: Secondary | ICD-10-CM | POA: Insufficient documentation

## 2018-08-29 DIAGNOSIS — Z79899 Other long term (current) drug therapy: Secondary | ICD-10-CM | POA: Insufficient documentation

## 2018-08-29 DIAGNOSIS — J45909 Unspecified asthma, uncomplicated: Secondary | ICD-10-CM | POA: Insufficient documentation

## 2018-08-29 HISTORY — DX: Cerebral infarction, unspecified: I63.9

## 2018-08-29 MED ORDER — NAPROXEN 250 MG PO TABS
500.0000 mg | ORAL_TABLET | Freq: Once | ORAL | Status: AC
  Administered 2018-08-29: 500 mg via ORAL
  Filled 2018-08-29: qty 2

## 2018-08-29 MED ORDER — ACETAMINOPHEN 500 MG PO TABS
1000.0000 mg | ORAL_TABLET | Freq: Once | ORAL | Status: AC
  Administered 2018-08-29: 1000 mg via ORAL
  Filled 2018-08-29: qty 2

## 2018-08-29 MED ORDER — MELOXICAM 7.5 MG PO TABS: 7.5000 mg | ORAL_TABLET | Freq: Every day | ORAL | 0 refills | Status: DC

## 2018-08-29 NOTE — ED Provider Notes (Signed)
[facility] EMERGENCY DEPARTMENT Provider Note   CSN: [id number] Arrival date & time: 08/29/18  0057    History   Chief Complaint Chief Complaint  Patient presents with  . Foot Pain    HPI Gerald Wheeler is a 58 y.o. male.     The history is provided by the patient.  Foot Pain  This is a new problem. The current episode started yesterday. The problem occurs constantly. The problem has not changed since onset.Pertinent negatives include no chest pain, no abdominal pain, no headaches and no shortness of breath. Nothing aggravates the symptoms. Nothing relieves the symptoms. He has tried nothing for the symptoms. The treatment provided no relief.  Is off his medication for his neuropathy.  No trauma.  Toes were swollen earlier but now are back to normal.  No ankle no calf pain.    Past Medical History:  Diagnosis Date  . Allergy   . Arthritis   . Asthma   . Diabetes mellitus without complication (HCC)   . Essential hypertension 02/27/2016  . High cholesterol   . History of stroke   . Hypertension   . Stroke [facility])     Patient Active Problem List   Diagnosis Date Noted  . Community acquired pneumonia of right lower lobe of lung (HCC)   . Chest tube in place   . Empyema (HCC) 10/22/2016  . PNA (pneumonia) 10/19/2016  . Right lower lobe pneumonia (HCC) 10/19/2016  . Hyperlipidemia 10/19/2016  . Tobacco abuse 10/19/2016  . Diabetes mellitus type 2, noninsulin dependent (HCC) 10/19/2016  . Leukocytosis 10/19/2016  . Hyponatremia 10/19/2016  . Microcytic anemia 10/19/2016  . Acute respiratory failure with hypoxia (HCC) 10/19/2016  . Parapneumonic effusion 10/19/2016  . Essential hypertension 02/27/2016    Past Surgical History:  Procedure Laterality Date  . BACK SURGERY    . CYSTECTOMY N/A    cyst removed about 30 years ago  . TONSILLECTOMY  1973  . VIDEO ASSISTED THORACOSCOPY (VATS)/THOROCOTOMY Right 10/22/2016   Procedure: VIDEO ASSISTED THORACOSCOPY  (VATS)/THOROCOTOMY for Empyema ;  Surgeon: Alleen Borne, MD;  Location: [facility] OR;  Service: Thoracic;  Laterality: Right;        Home Medications    Prior to Admission medications   Medication Sig Start Date End Date Taking? Authorizing Provider  alum & mag hydroxide-simeth (MAALOX/MYLANTA) 200-200-20 MG/5ML suspension Take 15 mLs by mouth every 6 (six) hours as needed for indigestion or heartburn. 10/26/16   Dhungel, Nishant, MD  amLODipine (NORVASC) 5 MG tablet Take 1 tablet (5 mg total) by mouth daily. 02/27/16   Sharlene Dory, DO  gabapentin (NEURONTIN) 400 MG capsule Take 1 capsule (400 mg total) by mouth 3 (three) times daily. 10/28/15   Rolland Porter, MD  guaiFENesin-dextromethorphan (ROBITUSSIN DM) 100-10 MG/5ML syrup Take 5 mLs by mouth every 4 (four) hours as needed for cough. 10/26/16   Dhungel, Nishant, MD  lisinopril (PRINIVIL,ZESTRIL) 40 MG tablet Take 1 tablet (40 mg total) by mouth daily. 10/28/15   Rolland Porter, MD  loratadine (CLARITIN) 10 MG tablet Take 10 mg by mouth daily.    [provider]  lovastatin (MEVACOR) 20 MG tablet Take 20 mg by mouth at bedtime.    [provider]  metFORMIN (GLUCOPHAGE) 500 MG tablet Take 1 tablet (500 mg total) by mouth 2 (two) times daily with a meal. 10/28/15   Rolland Porter, MD  metoprolol tartrate (LOPRESSOR) 25 MG tablet Take 25 mg by mouth daily.  [provider]  nicotine (NICODERM CQ - DOSED IN MG/24 HOURS) 14 mg/24hr patch Place 1 patch (14 mg total) onto the skin daily. 10/26/16   Dhungel, Theda Belfast, MD  predniSONE (STERAPRED UNI-PAK 21 TAB) 10 MG (21) TBPK tablet Take 6 tabs by mouth daily  for 2 days, then 5 tabs for 2 days, then 4 tabs for 2 days, then 3 tabs for 2 days, 2 tabs for 2 days, then 1 tab by mouth daily for 2 days 07/31/17   Jacalyn Lefevre, MD  traMADol (ULTRAM) 50 MG tablet Take 1 tablet (50 mg total) by mouth every 6 (six) hours as needed (mild pain). 11/17/16   Alleen Borne, MD  traMADol  (ULTRAM) 50 MG tablet Take 1 tablet (50 mg total) by mouth every 6 (six) hours as needed (mild pain). 11/17/16   Alleen Borne, MD    Family History Family History  Problem Relation Age of Onset  . Heart disease Mother   . High blood pressure Mother   . Heart disease Father   . Diabetes Father     Social History Social History   Tobacco Use  . Smoking status: Current Every Day Smoker    Types: Cigarettes  . Smokeless tobacco: Never Used  Substance Use Topics  . Alcohol use: Yes    Comment: weekly  . Drug use: Yes    Types: Marijuana     Allergies   Patient has no known allergies.   Review of Systems Review of Systems  Respiratory: Negative for shortness of breath.   Cardiovascular: Negative for chest pain, palpitations and leg swelling.  Gastrointestinal: Negative for abdominal pain.  Musculoskeletal: Positive for arthralgias.  Neurological: Negative for headaches.  All other systems reviewed and are negative.    Physical Exam Updated Vital Signs BP (!) 111/91 (BP Location: Left Arm)   Pulse 82   Temp 98.3 F (36.8 C) (Oral)   Resp 20   Ht 5\' 11"  (1.803 m)   Wt 95.3 kg   SpO2 100%   BMI 29.29 kg/m   Physical Exam Vitals signs and nursing note reviewed.  Constitutional:      Appearance: Normal appearance. He is normal weight.     Comments: Smells strongly of ETOH  HENT:     Head: Normocephalic and atraumatic.     Nose: Nose normal.     Mouth/Throat:     Mouth: Mucous membranes are moist.     Pharynx: Oropharynx is clear.  Eyes:     Conjunctiva/sclera: Conjunctivae normal.     Pupils: Pupils are equal, round, and reactive to light.  Neck:     Musculoskeletal: Normal range of motion and neck supple.  Cardiovascular:     Rate and Rhythm: Normal rate and regular rhythm.     Pulses: Normal pulses.     Heart sounds: Normal heart sounds.  Pulmonary:     Effort: Pulmonary effort is normal.     Breath sounds: Normal breath sounds.  Abdominal:      General: Abdomen is flat. Bowel sounds are normal.     Tenderness: There is no abdominal tenderness. There is no guarding or rebound.  Musculoskeletal:     Right ankle: Normal. Achilles tendon normal.     Left ankle: Normal. Achilles tendon normal.     Right foot: Normal.     Left foot: Normal range of motion and normal capillary refill. No tenderness, bony tenderness, swelling, crepitus, deformity or laceration.     Comments:  3+ dorsalis pedis of the left foot,  No warmth no erythema FROM of the digits.  Onchomycosis of toenails.  No swelling  Skin:    General: Skin is warm and dry.     Capillary Refill: Capillary refill takes less than 2 seconds.     Findings: No erythema.  Neurological:     General: No focal deficit present.     Mental Status: He is alert and oriented to person, place, and time.  Psychiatric:        Mood and Affect: Mood normal.        Behavior: Behavior normal.      ED Treatments / Results  Labs (all labs ordered are listed, but only abnormal results are displayed) Labs Reviewed - No data to display  EKG None  Radiology No results found.  Procedures Procedures (including critical care time)  Medications Ordered in ED Medications  naproxen (NAPROSYN) tablet 500 mg (500 mg Oral Given 08/29/18 0133)  acetaminophen (TYLENOL) tablet 1,000 mg (1,000 mg Oral Given 08/29/18 0133)     Pain is consistent with neuropathic pain.  Restart gabapentin.     Final Clinical Impressions(s) / ED Diagnoses   Return for pain, intractable cough, fevers >100.4 unrelieved by medication, shortness of breath, intractable vomiting, chest pain, shortness of breath, weakness numbness, changes in speech, facial asymmetry,abdominal pain, passing out,Inability to tolerate liquids or food, cough, altered mental status or any concerns. No signs of systemic illness or infection. The patient is nontoxic-appearing on exam and vital signs are within normal limits.   I have  reviewed the triage vital signs and the nursing notes. Pertinent labs &imaging results that were available during my care of the patient were reviewed by me and considered in my medical decision making (see chart for details).  After history, exam, and medical workup I feel the patient has been appropriately medically screened and is safe for discharge home. Pertinent diagnoses were discussed with the patient. Patient was given return precautions.    Bently Wyss, MD 08/29/18 [phone]

## 2018-08-29 NOTE — ED Triage Notes (Signed)
Pt is c/o pain and swelling to his left foot  Pt states it started yesterday and got worse today  Denies injury

## 2019-01-06 ENCOUNTER — Encounter (HOSPITAL_BASED_OUTPATIENT_CLINIC_OR_DEPARTMENT_OTHER): Payer: Self-pay | Admitting: Emergency Medicine

## 2019-01-06 ENCOUNTER — Emergency Department (HOSPITAL_BASED_OUTPATIENT_CLINIC_OR_DEPARTMENT_OTHER)
Admission: EM | Admit: 2019-01-06 | Discharge: 2019-01-07 | Disposition: A | Discharge status: Home / Self Care | Payer: Self-pay | Attending: Emergency Medicine | Admitting: Emergency Medicine

## 2019-01-06 ENCOUNTER — Other Ambulatory Visit: Payer: Self-pay

## 2019-01-06 DIAGNOSIS — F17210 Nicotine dependence, cigarettes, uncomplicated: Secondary | ICD-10-CM | POA: Insufficient documentation

## 2019-01-06 DIAGNOSIS — E119 Type 2 diabetes mellitus without complications: Secondary | ICD-10-CM | POA: Insufficient documentation

## 2019-01-06 DIAGNOSIS — Z7984 Long term (current) use of oral hypoglycemic drugs: Secondary | ICD-10-CM | POA: Insufficient documentation

## 2019-01-06 DIAGNOSIS — Y929 Unspecified place or not applicable: Secondary | ICD-10-CM | POA: Insufficient documentation

## 2019-01-06 DIAGNOSIS — I10 Essential (primary) hypertension: Secondary | ICD-10-CM | POA: Insufficient documentation

## 2019-01-06 DIAGNOSIS — R55 Syncope and collapse: Secondary | ICD-10-CM

## 2019-01-06 DIAGNOSIS — J45909 Unspecified asthma, uncomplicated: Secondary | ICD-10-CM | POA: Insufficient documentation

## 2019-01-06 DIAGNOSIS — Z8673 Personal history of transient ischemic attack (TIA), and cerebral infarction without residual deficits: Secondary | ICD-10-CM | POA: Insufficient documentation

## 2019-01-06 DIAGNOSIS — Y93G9 Activity, other involving cooking and grilling: Secondary | ICD-10-CM | POA: Insufficient documentation

## 2019-01-06 DIAGNOSIS — F10920 Alcohol use, unspecified with intoxication, uncomplicated: Secondary | ICD-10-CM | POA: Insufficient documentation

## 2019-01-06 DIAGNOSIS — W0110XA Fall on same level from slipping, tripping and stumbling with subsequent striking against unspecified object, initial encounter: Secondary | ICD-10-CM | POA: Insufficient documentation

## 2019-01-06 DIAGNOSIS — Y999 Unspecified external cause status: Secondary | ICD-10-CM | POA: Insufficient documentation

## 2019-01-06 DIAGNOSIS — Z79899 Other long term (current) drug therapy: Secondary | ICD-10-CM | POA: Insufficient documentation

## 2019-01-06 DIAGNOSIS — Y908 Blood alcohol level of 240 mg/100 ml or more: Secondary | ICD-10-CM | POA: Insufficient documentation

## 2019-01-06 DIAGNOSIS — S0181XA Laceration without foreign body of other part of head, initial encounter: Secondary | ICD-10-CM | POA: Insufficient documentation

## 2019-01-06 MED ORDER — SODIUM CHLORIDE 0.9 % IV BOLUS
1000.0000 mL | Freq: Once | INTRAVENOUS | Status: AC
  Administered 2019-01-07: 1000 mL via INTRAVENOUS

## 2019-01-06 NOTE — ED Triage Notes (Signed)
Pt states that he was out cooking out and has been drinking "quite a bit when he passed out all of a sudden. Laceration to forehead. Bleeding controlled in triage.

## 2019-01-07 ENCOUNTER — Emergency Department (HOSPITAL_BASED_OUTPATIENT_CLINIC_OR_DEPARTMENT_OTHER): Payer: Self-pay

## 2019-01-07 LAB — CBC WITH DIFFERENTIAL/PLATELET
Abs Immature Granulocytes: 0.01 10*3/uL (ref 0.00–0.07)
Basophils Absolute: 0 10*3/uL (ref 0.0–0.1)
Basophils Relative: 1 %
Eosinophils Absolute: 0.1 10*3/uL (ref 0.0–0.5)
Eosinophils Relative: 2 %
HCT: 41.1 % (ref 39.0–52.0)
Hemoglobin: 12.7 g/dL — ABNORMAL LOW (ref 13.0–17.0)
Immature Granulocytes: 0 %
Lymphocytes Relative: 40 %
Lymphs Abs: 2.3 10*3/uL (ref 0.7–4.0)
MCH: 23.9 pg — ABNORMAL LOW (ref 26.0–34.0)
MCHC: 30.9 g/dL (ref 30.0–36.0)
MCV: 77.3 fL — ABNORMAL LOW (ref 80.0–100.0)
Monocytes Absolute: 0.4 10*3/uL (ref 0.1–1.0)
Monocytes Relative: 7 %
Neutro Abs: 2.9 10*3/uL (ref 1.7–7.7)
Neutrophils Relative %: 50 %
Platelets: 248 10*3/uL (ref 150–400)
RBC: 5.32 MIL/uL (ref 4.22–5.81)
RDW: 16.3 % — ABNORMAL HIGH (ref 11.5–15.5)
WBC: 5.7 10*3/uL (ref 4.0–10.5)
nRBC: 0 % (ref 0.0–0.2)

## 2019-01-07 LAB — BASIC METABOLIC PANEL
Anion gap: 12 (ref 5–15)
BUN: 17 mg/dL (ref 6–20)
CO2: 19 mmol/L — ABNORMAL LOW (ref 22–32)
Calcium: 8.2 mg/dL — ABNORMAL LOW (ref 8.9–10.3)
Chloride: 112 mmol/L — ABNORMAL HIGH (ref 98–111)
Creatinine, Ser: 0.71 mg/dL (ref 0.61–1.24)
GFR calc Af Amer: >60 mL/min (ref >60)
GFR calc non Af Amer: >60 mL/min (ref >60)
Glucose, Bld: 93 mg/dL (ref 70–99)
Potassium: 3.6 mmol/L (ref 3.5–5.1)
Sodium: 143 mmol/L (ref 135–145)

## 2019-01-07 LAB — ETHANOL: Alcohol, Ethyl (B): 389 mg/dL (ref <10)

## 2019-01-07 NOTE — ED Provider Notes (Signed)
[facility] EMERGENCY DEPARTMENT Provider Note   CSN: [id number] Arrival date & time: 01/06/19  2307     History   Chief Complaint Chief Complaint  Patient presents with  . Loss of Consciousness    HPI Gerald Wheeler is a 58 y.o. male.     HPI  Is a 58 year old male with a history of diabetes, hypertension who presents after passing out.  Patient reports that he was cooking out this evening when all of a sudden "I just fell out."  He does endorse drinking alcohol today but reports that he drinks alcohol regularly and "I usually drink a lot more."  He does not think that this is alcohol related.  He denies chest pain or shortness of breath.  He did hit his head.  He is not on any anticoagulants.  He denies nausea, vomiting, dizziness.  He reports daily drinking.  He reports "drinking beer and liquor today."  Denies any other drug use.  Past Medical History:  Diagnosis Date  . Allergy   . Arthritis   . Asthma   . Diabetes mellitus without complication (HCC)   . Essential hypertension 02/27/2016  . High cholesterol   . History of stroke   . Hypertension   . Stroke [facility](HCC)     Patient Active Problem List   Diagnosis Date Noted  . Community acquired pneumonia of right lower lobe of lung (HCC)   . Chest tube in place   . Empyema (HCC) 10/22/2016  . PNA (pneumonia) 10/19/2016  . Right lower lobe pneumonia (HCC) 10/19/2016  . Hyperlipidemia 10/19/2016  . Tobacco abuse 10/19/2016  . Diabetes mellitus type 2, noninsulin dependent (HCC) 10/19/2016  . Leukocytosis 10/19/2016  . Hyponatremia 10/19/2016  . Microcytic anemia 10/19/2016  . Acute respiratory failure with hypoxia (HCC) 10/19/2016  . Parapneumonic effusion 10/19/2016  . Essential hypertension 02/27/2016    Past Surgical History:  Procedure Laterality Date  . BACK SURGERY    . CYSTECTOMY N/A    cyst removed about 30 years ago  . TONSILLECTOMY  1973  . VIDEO ASSISTED THORACOSCOPY (VATS)/THOROCOTOMY  Right 10/22/2016   Procedure: VIDEO ASSISTED THORACOSCOPY (VATS)/THOROCOTOMY for Empyema ;  Surgeon: Alleen BorneBryan K Bartle, MD;  [facility];  Service: Thoracic;  Laterality: Right;        Home Medications    Prior to Admission medications   Medication Sig Start Date End Date Taking? Authorizing Provider  alum & mag hydroxide-simeth (MAALOX/MYLANTA) 200-200-20 MG/5ML suspension Take 15 mLs by mouth every 6 (six) hours as needed for indigestion or heartburn. 10/26/16   Dhungel, Nishant, MD  amLODipine (NORVASC) 5 MG tablet Take 1 tablet (5 mg total) by mouth daily. 02/27/16   Sharlene DoryWendling, Nicholas Paul, DO  gabapentin (NEURONTIN) 400 MG capsule Take 1 capsule (400 mg total) by mouth 3 (three) times daily. 10/28/15   Rolland PorterJames, Mark, MD  guaiFENesin-dextromethorphan (ROBITUSSIN DM) 100-10 MG/5ML syrup Take 5 mLs by mouth every 4 (four) hours as needed for cough. 10/26/16   Dhungel, Nishant, MD  lisinopril (PRINIVIL,ZESTRIL) 40 MG tablet Take 1 tablet (40 mg total) by mouth daily. 10/28/15   Rolland PorterJames, Mark, MD  loratadine (CLARITIN) 10 MG tablet Take 10 mg by mouth daily.    [provider]  lovastatin (MEVACOR) 20 MG tablet Take 20 mg by mouth at bedtime.    [provider]  meloxicam (MOBIC) 7.5 MG tablet Take 1 tablet (7.5 mg total) by mouth daily. 08/29/18   Palumbo, April, MD  metFORMIN (  GLUCOPHAGE) 500 MG tablet Take 1 tablet (500 mg total) by mouth 2 (two) times daily with a meal. 10/28/15   Rolland PorterJames, Mark, MD  metoprolol tartrate (LOPRESSOR) 25 MG tablet Take 25 mg by mouth daily.    [provider]  nicotine (NICODERM CQ - DOSED IN MG/24 HOURS) 14 mg/24hr patch Place 1 patch (14 mg total) onto the skin daily. 10/26/16   Dhungel, Theda BelfastNishant, MD  predniSONE (STERAPRED UNI-PAK 21 TAB) 10 MG (21) TBPK tablet Take 6 tabs by mouth daily  for 2 days, then 5 tabs for 2 days, then 4 tabs for 2 days, then 3 tabs for 2 days, 2 tabs for 2 days, then 1 tab by mouth daily for 2 days 07/31/17   Jacalyn LefevreHaviland,  Julie, MD  traMADol (ULTRAM) 50 MG tablet Take 1 tablet (50 mg total) by mouth every 6 (six) hours as needed (mild pain). 11/17/16   Alleen BorneBartle, Bryan K, MD  traMADol (ULTRAM) 50 MG tablet Take 1 tablet (50 mg total) by mouth every 6 (six) hours as needed (mild pain). 11/17/16   Alleen BorneBartle, Bryan K, MD    Family History Family History  Problem Relation Age of Onset  . Heart disease Mother   . High blood pressure Mother   . Heart disease Father   . Diabetes Father     Social History Social History   Tobacco Use  . Smoking status: Current Every Day Smoker    Types: Cigarettes  . Smokeless tobacco: Never Used  Substance Use Topics  . Alcohol use: Yes    Comment: weekly  . Drug use: Yes    Types: Marijuana     Allergies   Patient has no known allergies.   Review of Systems Review of Systems  Constitutional: Negative for fever.  Respiratory: Negative for shortness of breath.   Cardiovascular: Negative for chest pain.  Gastrointestinal: Negative for abdominal pain, nausea and vomiting.  Genitourinary: Negative for dysuria.  Skin: Positive for wound.  Neurological: Positive for syncope.  Psychiatric/Behavioral: Negative for confusion.  All other systems reviewed and are negative.    Physical Exam Updated Vital Signs BP 121/89   Pulse 73   Temp 97.6 F (36.4 C) (Oral)   Resp 15   Ht 1.803 m (5\' 11" )   Wt 90.3 kg   SpO2 95%   BMI 27.75 kg/m   Physical Exam Vitals signs and nursing note reviewed.  Constitutional:      Appearance: He is well-developed.     Comments: Appears intoxicated, ABCs intact  HENT:     Head: Normocephalic.     Comments: Jagged 2 cm laceration/abrasion between the eyebrows, no significant bleeding, fairly well approximated    Right Ear: Tympanic membrane normal.     Left Ear: Tympanic membrane normal.  Eyes:     Extraocular Movements: Extraocular movements intact.     Pupils: Pupils are equal, round, and reactive to light.  Neck:      Musculoskeletal: Neck supple.  Cardiovascular:     Rate and Rhythm: Normal rate and regular rhythm.     Heart sounds: Normal heart sounds.  Pulmonary:     Effort: Pulmonary effort is normal. No respiratory distress.     Breath sounds: Normal breath sounds.  Abdominal:     Palpations: Abdomen is soft.     Tenderness: There is no abdominal tenderness. There is no rebound.  Musculoskeletal:        General: No deformity.  Lymphadenopathy:     Cervical:  No cervical adenopathy.  Skin:    General: Skin is warm and dry.  Neurological:     Mental Status: He is alert and oriented to person, place, and time.  Psychiatric:     Comments: Intoxicated      ED Treatments / Results  Labs (all labs ordered are listed, but only abnormal results are displayed) Labs Reviewed  CBC WITH DIFFERENTIAL/PLATELET - Abnormal; Notable for the following components:      Result Value   Hemoglobin 12.7 (*)    MCV 77.3 (*)    MCH 23.9 (*)    RDW 16.3 (*)    All other components within normal limits  BASIC METABOLIC PANEL - Abnormal; Notable for the following components:   Chloride 112 (*)    CO2 19 (*)    Calcium 8.2 (*)    All other components within normal limits  ETHANOL - Abnormal; Notable for the following components:   Alcohol, Ethyl (B) 389 (*)    All other components within normal limits    EKG EKG Interpretation  Date/Time:  Saturday January 06 2019 23:18:51 EDT Ventricular Rate:  71 PR Interval:    QRS Duration: 88 QT Interval:  413 QTC Calculation: 449 R Axis:   -13 Text Interpretation:  Sinus rhythm Baseline wander in lead(s) II aVF Confirmed by Thayer Jew [phone]) on 01/06/2019 11:37:28 PM   Radiology Ct Head Wo Contrast  Result Date: 01/07/2019 CLINICAL DATA:  Head trauma, minor, GCS>=13, high clinical risk, initial exam EXAM: CT HEAD WITHOUT CONTRAST TECHNIQUE: Contiguous axial images were obtained from the base of the skull through the vertex without intravenous contrast.  COMPARISON:  None. FINDINGS: Brain: No evidence of acute infarction, hemorrhage, hydrocephalus, extra-axial collection or mass lesion/mass effect. Generalized atrophy slightly advanced for age. Remote lacunar infarcts in the right-greater-than-left cerebellum. Vascular: Age advanced vascular calcifications with skull base. No hyperdense vessel. Skull: No fracture or focal lesion. Sinuses/Orbits: Paranasal sinuses and mastoid air cells are clear. The visualized orbits are unremarkable. Other: Small mid frontal scalp laceration. IMPRESSION: 1. No acute intracranial abnormality. No skull fracture. 2. Generalized atrophy, age advanced. Remote lacunar infarcts in the cerebellum. Electronically Signed   By: Keith Rake M.D.   On: 01/07/2019 00:51    Procedures .Marland KitchenLaceration Repair  Date/Time: 01/07/2019 2:35 AM Performed by: Merryl Hacker, MD Authorized by: Merryl Hacker, MD   Consent:    Consent obtained:  Verbal   Consent given by:  Patient   Risks discussed:  Infection, pain, poor cosmetic result and poor wound healing   Alternatives discussed:  No treatment Anesthesia (see MAR for exact dosages):    Anesthesia method:  None Laceration details:    Location:  Face   Face location:  Forehead   Length (cm):  2   Depth (mm):  5 Repair type:    Repair type:  Simple Exploration:    Wound exploration: wound explored through full range of motion     Wound extent: no areolar tissue violation noted, no fascia violation noted, no foreign bodies/material noted and no underlying fracture noted     Contaminated: no   Treatment:    Area cleansed with:  Saline   Amount of cleaning:  Standard   Irrigation solution:  Sterile saline   Irrigation method:  Pressure wash   Visualized foreign bodies/material removed: no   Skin repair:    Repair method:  Steri-Strips and tissue adhesive   Number of Steri-Strips:  4 Approximation:  Approximation:  Close Post-procedure details:    Dressing:   Open (no dressing)   (including critical care time)  Medications Ordered in ED Medications  sodium chloride 0.9 % bolus 1,000 mL (0 mLs Intravenous Stopped 01/07/19 0123)     Initial Impression / Assessment and Plan / ED Course  I have reviewed the triage vital signs and the nursing notes.  Pertinent labs & imaging results that were available during my care of the patient were reviewed by me and considered in my medical decision making (see chart for details).        Presents after syncopal episode.  He is overall nontoxic-appearing but does appear intoxicated.  Reports that he does not believe that this is related.  EKG shows no evidence of arrhythmia.  Basic lab work obtained and is largely reassuring.  CT head is negative for acute bleed.  Laceration is well approximated.  Because of this, Steri-Strips and glue were applied.  Blood alcohol level is 389.  Highly suspicious that this is at least somewhat contributory to his syncopal episode.  Patient was given fluids.  He is ambulatory at discharge.  He will be discharged to the care of his family.  Final Clinical Impressions(s) / ED Diagnoses   Final diagnoses:  Alcoholic intoxication without complication (HCC)  Syncope, unspecified syncope type  Facial laceration, initial encounter    ED Discharge Orders    None       Shon BatonHorton, Valerie Cones F, MD 01/07/19 [phone]

## 2019-01-07 NOTE — ED Notes (Signed)
Pt understood dc material. NAD noted. All questions answered to satisfaction. Pt escorted to check out counter and then to family members car.

## 2019-01-07 NOTE — ED Notes (Signed)
Pt was able to ambulate to restroom without assistance and with steady gait. NAD noted.

## 2019-01-07 NOTE — ED Notes (Signed)
Pt called out, requesting to have someone come into room. RN into room, pt upset regarding length of time to be seen. Apologized to patient regarding length of stay. Chanin, charge RN made aware.

## 2019-01-07 NOTE — Discharge Instructions (Addendum)
You were seen today for loss of consciousness.  This is likely at least somewhat related to your alcohol consumption.  The rest of your work-up is reassuring.  Your laceration was repaired with glue.  Allow Steri-Strips to fall off on their own.

## 2019-01-07 NOTE — ED Notes (Signed)
Pt to CT at this time.

## 2019-01-07 NOTE — ED Notes (Addendum)
Dr. Dina Rich aware of etoh results of 389

## 2019-01-07 NOTE — ED Notes (Signed)
ED Provider at bedside. 

## 2019-01-07 NOTE — ED Notes (Signed)
Patient transported to CT 

## 2019-01-21 ENCOUNTER — Encounter (HOSPITAL_COMMUNITY): Payer: Self-pay | Admitting: Behavioral Health

## 2019-01-21 ENCOUNTER — Other Ambulatory Visit: Payer: Self-pay

## 2019-01-21 ENCOUNTER — Inpatient Hospital Stay (HOSPITAL_COMMUNITY)
Admission: RE | Admit: 2019-01-21 | Discharge: 2019-01-24 | DRG: 881 | Disposition: A | Discharge status: Home / Self Care | Payer: Federal, State, Local not specified - Other | Attending: Psychiatry | Admitting: Psychiatry

## 2019-01-21 DIAGNOSIS — Z1159 Encounter for screening for other viral diseases: Secondary | ICD-10-CM

## 2019-01-21 DIAGNOSIS — R45851 Suicidal ideations: Secondary | ICD-10-CM | POA: Diagnosis present

## 2019-01-21 DIAGNOSIS — E7800 Pure hypercholesterolemia, unspecified: Secondary | ICD-10-CM | POA: Diagnosis present

## 2019-01-21 DIAGNOSIS — F1024 Alcohol dependence with alcohol-induced mood disorder: Secondary | ICD-10-CM | POA: Diagnosis not present

## 2019-01-21 DIAGNOSIS — F17210 Nicotine dependence, cigarettes, uncomplicated: Secondary | ICD-10-CM | POA: Diagnosis present

## 2019-01-21 DIAGNOSIS — E119 Type 2 diabetes mellitus without complications: Secondary | ICD-10-CM | POA: Diagnosis not present

## 2019-01-21 DIAGNOSIS — F329 Major depressive disorder, single episode, unspecified: Secondary | ICD-10-CM | POA: Diagnosis present

## 2019-01-21 DIAGNOSIS — F322 Major depressive disorder, single episode, severe without psychotic features: Secondary | ICD-10-CM | POA: Diagnosis not present

## 2019-01-21 DIAGNOSIS — F1994 Other psychoactive substance use, unspecified with psychoactive substance-induced mood disorder: Secondary | ICD-10-CM | POA: Diagnosis not present

## 2019-01-21 DIAGNOSIS — G4700 Insomnia, unspecified: Secondary | ICD-10-CM | POA: Diagnosis present

## 2019-01-21 DIAGNOSIS — I10 Essential (primary) hypertension: Secondary | ICD-10-CM | POA: Diagnosis present

## 2019-01-21 LAB — SARS CORONAVIRUS 2 BY RT PCR (HOSPITAL ORDER, PERFORMED IN ~~LOC~~ HOSPITAL LAB): SARS Coronavirus 2: NEGATIVE

## 2019-01-21 MED ORDER — TRAZODONE HCL 50 MG PO TABS
50.0000 mg | ORAL_TABLET | Freq: Every evening | ORAL | Status: DC | PRN
  Administered 2019-01-21: 50 mg via ORAL
  Filled 2019-01-21: qty 1

## 2019-01-21 MED ORDER — LISINOPRIL 40 MG PO TABS
40.0000 mg | ORAL_TABLET | Freq: Every day | ORAL | Status: DC
  Administered 2019-01-21 – 2019-01-24 (×4): 40 mg via ORAL
  Filled 2019-01-21 (×5): qty 1

## 2019-01-21 MED ORDER — HYDROXYZINE HCL 25 MG PO TABS
25.0000 mg | ORAL_TABLET | Freq: Three times a day (TID) | ORAL | Status: DC | PRN
  Administered 2019-01-22 – 2019-01-23 (×2): 25 mg via ORAL
  Filled 2019-01-21 (×2): qty 1

## 2019-01-21 MED ORDER — NICOTINE 21 MG/24HR TD PT24
21.0000 mg | MEDICATED_PATCH | Freq: Every day | TRANSDERMAL | Status: DC
  Administered 2019-01-21 – 2019-01-24 (×4): 21 mg via TRANSDERMAL
  Filled 2019-01-21 (×5): qty 1

## 2019-01-21 MED ORDER — ALUM & MAG HYDROXIDE-SIMETH 200-200-20 MG/5ML PO SUSP: 30.0000 mL | ORAL | Status: DC | PRN

## 2019-01-21 MED ORDER — ATORVASTATIN CALCIUM 40 MG PO TABS
40.0000 mg | ORAL_TABLET | Freq: Every day | ORAL | Status: DC
  Administered 2019-01-21 – 2019-01-23 (×3): 40 mg via ORAL
  Filled 2019-01-21 (×4): qty 1

## 2019-01-21 MED ORDER — METOPROLOL TARTRATE 25 MG PO TABS
25.0000 mg | ORAL_TABLET | Freq: Every day | ORAL | Status: DC
  Administered 2019-01-21 – 2019-01-24 (×4): 25 mg via ORAL
  Filled 2019-01-21 (×5): qty 1

## 2019-01-21 MED ORDER — GABAPENTIN 600 MG PO TABS
600.0000 mg | ORAL_TABLET | Freq: Three times a day (TID) | ORAL | Status: DC
  Administered 2019-01-21 – 2019-01-24 (×10): 600 mg via ORAL
  Filled 2019-01-21 (×13): qty 1

## 2019-01-21 MED ORDER — MELOXICAM 15 MG PO TABS
15.0000 mg | ORAL_TABLET | Freq: Every day | ORAL | Status: DC
  Administered 2019-01-21 – 2019-01-24 (×4): 15 mg via ORAL
  Filled 2019-01-21 (×5): qty 1

## 2019-01-21 MED ORDER — MAGNESIUM HYDROXIDE 400 MG/5ML PO SUSP: 30.0000 mL | Freq: Every day | ORAL | Status: DC | PRN

## 2019-01-21 MED ORDER — ACETAMINOPHEN 325 MG PO TABS: 650.0000 mg | ORAL_TABLET | Freq: Four times a day (QID) | ORAL | Status: DC | PRN

## 2019-01-21 NOTE — Progress Notes (Signed)
Gerald Wheeler is a 58 year old male pt admitted on voluntary basis after presenting as a walk-in. He reports that he fell on July 4 and reports that he has been feeling bad since then. He reports that he has never been hospitalized for a psychiatric problem in the past. He does currently endorse passive SI but able to contract for safety in the hospital. He reports drinking on a daily basis and also uses marijuana on a regular basis. He reports that he takes his medications as prescribed. He reports that he lives with his girlfriend and will return there once he is discharged. [name] was cooperative during admission process, was oriented to the milieu and safety maintained.

## 2019-01-21 NOTE — Tx Team (Signed)
Initial Treatment Plan 01/21/2019 4:07 PM Gerald Wheeler [id number]    PATIENT STRESSORS: Health problems Substance abuse   PATIENT STRENGTHS: Ability for insight Average or above average intelligence Capable of independent living General fund of knowledge Motivation for treatment/growth Supportive family/friends   PATIENT IDENTIFIED PROBLEMS: Depression Suicidal thoughts Alcohol abuse "I just started feeling bad, like a dark cloud out of nowhere"                     DISCHARGE CRITERIA:  Ability to meet basic life and health needs Improved stabilization in mood, thinking, and/or behavior Reduction of life-threatening or endangering symptoms to within safe limits Verbal commitment to aftercare and medication compliance Withdrawal symptoms are absent or subacute and managed without 24-hour nursing intervention  PRELIMINARY DISCHARGE PLAN: Attend aftercare/continuing care group Return to previous living arrangement  PATIENT/FAMILY INVOLVEMENT: This treatment plan has been presented to and reviewed with the patient, Gerald Wheeler, and/or family member, .  The patient and family have been given the opportunity to ask questions and make suggestions.  Rankin, [place], [place] 01/21/2019, 4:07 PM

## 2019-01-21 NOTE — BH Assessment (Signed)
Assessment Note  Gerald Wheeler is a 58 y.o. male who presented to [facility] as a voluntary walk-in with complaint of suicidal ideation, despondency, and other symptoms.  Pt was accompanied by his cousins.  Pt lives in [place] with his girlfriend.  He is not followed by any outpatient psychiatrist, and he has not received outpatient treatment in the past.  Pt reported as follows:  On July 4th, he collapsed while at work and injured himself.  He has been out of work since, and recently, he lost his job.  Pt reported that over the last few weeks, he has felt increasingly despondent.  Over the last two-three days, he has experienced the following symptoms:  Persistent despondency; suicidal ideation with expressed thoughts of using a gun or knife to kill himself; disturbed sleep and appetite; feelings of worthlessness and hopelessness; isolation; guilt; irritability; tearfulness.  Pt approached his family yesterday and expressed a desire to die.  In addition to these symptoms, Pt endorsed daily use of marijuana (about a joint per day) and regular use of alcohol.    During assessment, Pt presented as alert and oriented. He had good eye contact and was cooperative.  Mood and affect were depressed.  Demeanor was tearful.  Pt was dressed in street clothes, and he appeared appropriately groomed.  Pt's speech was normal in rate, rhythm, and volume.  Pt's thought processes were within normal range, and thought content was logical and goal-oriented.  There was no evidence of delusion or hallucination.  Pt denied homicidal ideation and self-injurious behavior.  Memory and concentration were intact.  Insight, judgment, and impulse control were fair.  Consulted with T. Money, NP, who also met with Pt.  It is determined that Pt meets inpatient placement criteria.  Diagnosis: Major Depressive Disorder, Single Ep., Severe w/o psychotic features  Past Medical History:  Past Medical History:  Diagnosis Date  . Allergy    . Arthritis   . Asthma   . Diabetes mellitus without complication ([place])   . Essential hypertension 02/27/2016  . High cholesterol   . History of stroke   . Hypertension   . Stroke [facility])     Past Surgical History:  Procedure Laterality Date  . BACK SURGERY    . CYSTECTOMY N/A    cyst removed about 30 years ago  . TONSILLECTOMY  1973  . VIDEO ASSISTED THORACOSCOPY (VATS)/THOROCOTOMY Right 10/22/2016   Procedure: VIDEO ASSISTED THORACOSCOPY (VATS)/THOROCOTOMY for Empyema ;  Surgeon: Gaye Pollack, MD;  Location: [facility] OR;  Service: Thoracic;  Laterality: Right;    Family History:  Family History  Problem Relation Age of Onset  . Heart disease Mother   . High blood pressure Mother   . Heart disease Father   . Diabetes Father     Social History:  reports that he has been smoking cigarettes. He has never used smokeless tobacco. He reports current alcohol use. He reports current drug use. Frequency: 7.00 times per week. Drug: Marijuana.  Additional Social History:     CIWA: CIWA-Ar BP: (!) 120/106 Pulse Rate: 95 COWS:    Allergies: No Known Allergies  Home Medications:  Medications Prior to Admission  Medication Sig Dispense Refill  . alum & mag hydroxide-simeth (MAALOX/MYLANTA) 200-200-20 MG/5ML suspension Take 15 mLs by mouth every 6 (six) hours as needed for indigestion or heartburn. 355 mL 0  . amLODipine (NORVASC) 5 MG tablet Take 1 tablet (5 mg total) by mouth daily. 30 tablet 0  . gabapentin (NEURONTIN)  400 MG capsule Take 1 capsule (400 mg total) by mouth 3 (three) times daily. 120 capsule 0  . guaiFENesin-dextromethorphan (ROBITUSSIN DM) 100-10 MG/5ML syrup Take 5 mLs by mouth every 4 (four) hours as needed for cough. 118 mL 0  . lisinopril (PRINIVIL,ZESTRIL) 40 MG tablet Take 1 tablet (40 mg total) by mouth daily. 30 tablet 0  . loratadine (CLARITIN) 10 MG tablet Take 10 mg by mouth daily.    Marland Kitchen lovastatin (MEVACOR) 20 MG tablet Take 20 mg by mouth at bedtime.    .  meloxicam (MOBIC) 7.5 MG tablet Take 1 tablet (7.5 mg total) by mouth daily. 7 tablet 0  . metFORMIN (GLUCOPHAGE) 500 MG tablet Take 1 tablet (500 mg total) by mouth 2 (two) times daily with a meal. 30 tablet 0  . metoprolol tartrate (LOPRESSOR) 25 MG tablet Take 25 mg by mouth daily.    . nicotine (NICODERM CQ - DOSED IN MG/24 HOURS) 14 mg/24hr patch Place 1 patch (14 mg total) onto the skin daily. 28 patch 0  . predniSONE (STERAPRED UNI-PAK 21 TAB) 10 MG (21) TBPK tablet Take 6 tabs by mouth daily  for 2 days, then 5 tabs for 2 days, then 4 tabs for 2 days, then 3 tabs for 2 days, 2 tabs for 2 days, then 1 tab by mouth daily for 2 days 42 tablet 0  . traMADol (ULTRAM) 50 MG tablet Take 1 tablet (50 mg total) by mouth every 6 (six) hours as needed (mild pain). 30 tablet 0  . traMADol (ULTRAM) 50 MG tablet Take 1 tablet (50 mg total) by mouth every 6 (six) hours as needed (mild pain). 30 tablet 0    OB/GYN Status:  No LMP for male patient.  General Assessment Data Location of Assessment: [facility] Assessment Services TTS Assessment: In system Is this a Tele or Face-to-Face Assessment?: Face-to-Face Is this an Initial Assessment or a Re-assessment for this encounter?: Initial Assessment Patient Accompanied by:: Other(Cousins) Language Other than English: No Living Arrangements: Other (Comment) What gender do you identify as?: Male Marital status: Long term relationship Living Arrangements: Spouse/significant other Can pt return to current living arrangement?: Yes Admission Status: Voluntary Is patient capable of signing voluntary admission?: Yes Referral Source: Self/Family/Friend Insurance type: No     Crisis Care Plan Living Arrangements: Spouse/significant other Name of Psychiatrist: No Name of Therapist: No  Education Status Is patient currently in school?: No Is the patient employed, unemployed or receiving disability?: Unemployed  Risk to self with the past 6 months Suicidal  Ideation: Yes-Currently Present Has patient been a risk to self within the past 6 months prior to admission? : No Suicidal Intent: No Has patient had any suicidal intent within the past 6 months prior to admission? : No Is patient at risk for suicide?: Yes Suicidal Plan?: Yes-Currently Present Has patient had any suicidal plan within the past 6 months prior to admission? : No Specify Current Suicidal Plan: Shoot self, cut self Access to Means: No What has been your use of drugs/alcohol within the last 12 months?: Marijuana, alcohol Previous Attempts/Gestures: No Intentional Self Injurious Behavior: None Family Suicide History: Unknown Recent stressful life event(s): Job Loss, Other (Comment)(Physical injury on July 4th) Persecutory voices/beliefs?: No Depression: Yes Depression Symptoms: Despondent, Insomnia, Tearfulness, Isolating, Guilt, Loss of interest in usual pleasures, Feeling worthless/self pity, Feeling angry/irritable Substance abuse history and/or treatment for substance abuse?: Yes Suicide prevention information given to non-admitted patients: Not applicable  Risk to Others within the past  6 months Homicidal Ideation: No Does patient have any lifetime risk of violence toward others beyond the six months prior to admission? : No Thoughts of Harm to Others: No Current Homicidal Intent: No Current Homicidal Plan: No Access to Homicidal Means: No History of harm to others?: No Assessment of Violence: None Noted Does patient have access to weapons?: No Criminal Charges Pending?: No Does patient have a court date: No Is patient on probation?: No  Psychosis Hallucinations: None noted Delusions: None noted  Mental Status Report Appearance/Hygiene: Unremarkable, Other (Comment)(Street clothes) Eye Contact: Good Motor Activity: Freedom of movement, Unremarkable Speech: Logical/coherent Level of Consciousness: Alert Mood: Depressed Affect: Depressed Anxiety Level:  Minimal Thought Processes: Relevant, Coherent Judgement: Partial Orientation: Person, Place, Time, Situation Obsessive Compulsive Thoughts/Behaviors: None  Cognitive Functioning Concentration: Normal Memory: Recent Intact, Remote Intact Is patient IDD: No Insight: Fair Impulse Control: Fair Appetite: Poor Have you had any weight changes? : No Change Sleep: Decreased Total Hours of Sleep: (Not sure)  ADLScreening [facility] Assessment Services) Patient's cognitive ability adequate to safely complete daily activities?: Yes Patient able to express need for assistance with ADLs?: Yes Independently performs ADLs?: Yes (appropriate for developmental age)  Prior Inpatient Therapy Prior Inpatient Therapy: No  Prior Outpatient Therapy Prior Outpatient Therapy: No Does patient have an ACCT team?: No Does patient have Intensive In-House Services?  : No Does patient have [facility] services? : No Does patient have P4CC services?: No  ADL Screening (condition at time of admission) Patient's cognitive ability adequate to safely complete daily activities?: Yes Is the patient deaf or have difficulty hearing?: No Does the patient have difficulty seeing, even when wearing glasses/contacts?: No Does the patient have difficulty concentrating, remembering, or making decisions?: No Patient able to express need for assistance with ADLs?: Yes Does the patient have difficulty dressing or bathing?: No Independently performs ADLs?: Yes (appropriate for developmental age) Does the patient have difficulty walking or climbing stairs?: No Weakness of Legs: None Weakness of Arms/Hands: None  Home Assistive Devices/Equipment Home Assistive Devices/Equipment: None  Therapy Consults (therapy consults require a physician order) PT Evaluation Needed: No OT Evalulation Needed: No SLP Evaluation Needed: No Abuse/Neglect Assessment (Assessment to be complete while patient is alone) Abuse/Neglect Assessment Can Be  Completed: Yes Physical Abuse: Denies Verbal Abuse: Denies Sexual Abuse: Denies Exploitation of patient/patient's resources: Denies Self-Neglect: Denies Values / Beliefs Cultural Requests During Hospitalization: None Spiritual Requests During Hospitalization: None Consults Spiritual Care Consult Needed: No Social Work Consult Needed: No Regulatory affairs officer (For Healthcare) Does Patient Have a Medical Advance Directive?: No          Disposition:  Disposition Initial Assessment Completed for this Encounter: Yes Disposition of Patient: Admit(Per T. Money, NP)  On Site Evaluation by:   Reviewed with Physician:    Laurena Slimmer Alain Deschene 01/21/2019 11:56 AM

## 2019-01-21 NOTE — H&P (Signed)
Behavioral Health Medical Screening Exam  Gerald Wheeler is an 58 y.o. male.  Total Time spent with patient: 20 minutes  Psychiatric Specialty Exam: Physical Exam  Nursing note and vitals reviewed. Constitutional: He is oriented to person, place, and time. He appears well-developed and well-nourished.  Cardiovascular: Normal rate.  Respiratory: Effort normal.  Musculoskeletal: Normal range of motion.  Neurological: He is alert and oriented to person, place, and time.    Review of Systems  Constitutional: Negative.   HENT: Negative.   Eyes: Negative.   Respiratory: Negative.   Cardiovascular: Negative.   Gastrointestinal: Negative.   Genitourinary: Negative.   Musculoskeletal: Negative.   Skin: Negative.   Neurological: Negative.   Endo/Heme/Allergies: Negative.   Psychiatric/Behavioral: Positive for depression and suicidal ideas. Negative for hallucinations and substance abuse.    Blood pressure (!) 120/106, pulse 95, temperature 98 F (36.7 C), temperature source Oral, resp. rate 18, SpO2 99 %.There is no height or weight on file to calculate BMI.  General Appearance: Casual  Eye Contact:  Fair  Speech:  Clear and Coherent and Normal Rate  Volume:  Decreased  Mood:  Depressed  Affect:  Flat  Thought Process:  Coherent and Descriptions of Associations: Intact  Orientation:  Full (Time, Place, and Person)  Thought Content:  WDL  Suicidal Thoughts:  Yes.  with intent/plan  Homicidal Thoughts:  No  Memory:  Immediate;   Good Recent;   Good Remote;   Good  Judgement:  Fair  Insight:  Lacking  Psychomotor Activity:  Normal  Concentration: Concentration: Good and Attention Span: Good  Recall:  Good  Fund of Knowledge:Fair  Language: Good  Akathisia:  No  Handed:  Right  AIMS (if indicated):     Assets:  Communication Skills Desire for Improvement Financial Resources/Insurance Housing Resilience Social Support Transportation  Sleep:        Musculoskeletal: Strength & Muscle Tone: within normal limits Gait & Station: normal Patient leans: N/A  Blood pressure (!) 120/106, pulse 95, temperature 98 F (36.7 C), temperature source Oral, resp. rate 18, SpO2 99 %.  Recommendations:  Based on my evaluation the patient does not appear to have an emergency medical condition.  [place], FNP 01/21/2019, 12:09 PM

## 2019-01-21 NOTE — BHH Group Notes (Addendum)
Life SKills   Date:  01/21/2019  Time:  1300  Type of Therapy:  Nurse Education   "  The group focuses on teaching patients how to identify their unhealthy coping skills and how to develop and practice newer and helathier ways to get their needs met.   Participation Level:  Did Not Attend  Participation Quality:    Affect:    Cognitive:    Insight:    Engagement in Group:    Modes of Intervention:    Summary of Progress/Problems:  Lauralyn Primes 01/21/2019, 3:52 PM

## 2019-01-21 NOTE — BHH Group Notes (Signed)
Pt did not attend wrap up group this evening. Pt was in bed asleep.  

## 2019-01-21 NOTE — Progress Notes (Signed)
Patient stated he also takes norvasc 15 mg at home for BP, prescribed by [facility] for several yrs.

## 2019-01-22 DIAGNOSIS — I10 Essential (primary) hypertension: Secondary | ICD-10-CM

## 2019-01-22 DIAGNOSIS — E119 Type 2 diabetes mellitus without complications: Secondary | ICD-10-CM

## 2019-01-22 DIAGNOSIS — M1991 Primary osteoarthritis, unspecified site: Secondary | ICD-10-CM

## 2019-01-22 DIAGNOSIS — F1024 Alcohol dependence with alcohol-induced mood disorder: Secondary | ICD-10-CM

## 2019-01-22 DIAGNOSIS — F1994 Other psychoactive substance use, unspecified with psychoactive substance-induced mood disorder: Secondary | ICD-10-CM

## 2019-01-22 LAB — COMPREHENSIVE METABOLIC PANEL
ALT: 46 U/L — ABNORMAL HIGH (ref 0–44)
AST: 40 U/L (ref 15–41)
Albumin: 3.9 g/dL (ref 3.5–5.0)
Alkaline Phosphatase: 50 U/L (ref 38–126)
Anion gap: 14 (ref 5–15)
BUN: 6 mg/dL (ref 6–20)
CO2: 20 mmol/L — ABNORMAL LOW (ref 22–32)
Calcium: 8.9 mg/dL (ref 8.9–10.3)
Chloride: 101 mmol/L (ref 98–111)
Creatinine, Ser: 0.71 mg/dL (ref 0.61–1.24)
GFR calc Af Amer: >60 mL/min (ref >60)
GFR calc non Af Amer: >60 mL/min (ref >60)
Glucose, Bld: 152 mg/dL — ABNORMAL HIGH (ref 70–99)
Potassium: 3.5 mmol/L (ref 3.5–5.1)
Sodium: 135 mmol/L (ref 135–145)
Total Bilirubin: 0.4 mg/dL (ref 0.3–1.2)
Total Protein: 7.6 g/dL (ref 6.5–8.1)

## 2019-01-22 LAB — CBC
HCT: 44.7 % (ref 39.0–52.0)
Hemoglobin: 13.9 g/dL (ref 13.0–17.0)
MCH: 24 pg — ABNORMAL LOW (ref 26.0–34.0)
MCHC: 31.1 g/dL (ref 30.0–36.0)
MCV: 77.3 fL — ABNORMAL LOW (ref 80.0–100.0)
Platelets: 201 10*3/uL (ref 150–400)
RBC: 5.78 MIL/uL (ref 4.22–5.81)
RDW: 18.1 % — ABNORMAL HIGH (ref 11.5–15.5)
WBC: 4.5 10*3/uL (ref 4.0–10.5)
nRBC: 0 % (ref 0.0–0.2)

## 2019-01-22 LAB — RAPID URINE DRUG SCREEN, HOSP PERFORMED
Amphetamines: NOT DETECTED
Barbiturates: NOT DETECTED
Benzodiazepines: NOT DETECTED
Cocaine: NOT DETECTED
Opiates: NOT DETECTED
Tetrahydrocannabinol: POSITIVE — AB

## 2019-01-22 LAB — TSH: TSH: 3.061 u[IU]/mL (ref 0.350–4.500)

## 2019-01-22 LAB — HEMOGLOBIN A1C
Hgb A1c MFr Bld: 5.7 % — ABNORMAL HIGH (ref 4.8–5.6)
Mean Plasma Glucose: 116.89 mg/dL

## 2019-01-22 MED ORDER — AMLODIPINE BESYLATE 5 MG PO TABS
5.0000 mg | ORAL_TABLET | Freq: Every day | ORAL | Status: DC
  Administered 2019-01-22 – 2019-01-24 (×3): 5 mg via ORAL
  Filled 2019-01-22 (×4): qty 1

## 2019-01-22 MED ORDER — TRAZODONE HCL 100 MG PO TABS
100.0000 mg | ORAL_TABLET | Freq: Every evening | ORAL | Status: DC | PRN
  Administered 2019-01-22 – 2019-01-23 (×2): 100 mg via ORAL
  Filled 2019-01-22 (×2): qty 1

## 2019-01-22 MED ORDER — FOLIC ACID 1 MG PO TABS
1.0000 mg | ORAL_TABLET | Freq: Every day | ORAL | Status: DC
  Administered 2019-01-22 – 2019-01-24 (×3): 1 mg via ORAL
  Filled 2019-01-22 (×4): qty 1

## 2019-01-22 MED ORDER — CHLORDIAZEPOXIDE HCL 25 MG PO CAPS
25.0000 mg | ORAL_CAPSULE | Freq: Four times a day (QID) | ORAL | Status: DC | PRN
  Administered 2019-01-23: 25 mg via ORAL
  Filled 2019-01-22: qty 1

## 2019-01-22 MED ORDER — VITAMIN B-1 100 MG PO TABS
100.0000 mg | ORAL_TABLET | Freq: Every day | ORAL | Status: DC
  Administered 2019-01-22 – 2019-01-24 (×3): 100 mg via ORAL
  Filled 2019-01-22 (×4): qty 1

## 2019-01-22 MED ORDER — SERTRALINE HCL 25 MG PO TABS
25.0000 mg | ORAL_TABLET | Freq: Every day | ORAL | Status: DC
  Administered 2019-01-22 – 2019-01-23 (×2): 25 mg via ORAL
  Filled 2019-01-22 (×3): qty 1

## 2019-01-22 NOTE — BHH Suicide Risk Assessment (Signed)
[facility] Admission Suicide Risk Assessment   Nursing information obtained from:  Patient Demographic factors:  Male Current Mental Status:  Suicidal ideation indicated by patient, Self-harm thoughts Loss Factors:  Decline in physical health Historical Factors:  Family history of mental illness or substance abuse Risk Reduction Factors:  Living with another person, especially a relative, Positive therapeutic relationship, Positive coping skills or problem solving skills  Total Time spent with patient: 30 minutes Principal Problem: <principal problem not specified> Diagnosis:  Active Problems:   MDD (major depressive disorder), single episode, severe ([place])  Subjective Data: Patient is seen and examined.  Patient is a 58 year old male who presented as a walk-in evaluation to the behavioral health hospital with complaint of suicidal ideation, being depressed, and not knowing what to do.  During the interview today he was having difficulty expressing why he needed to be in the psychiatric hospital.  He stated that he has been out of work for the last several weeks.  He also stated that the weather was extremely hot.  He stated that he had a place to live, but was vague.  He stated that over the last several months he had several physical problems which caused him a great deal of work loss, and why lost his jobs.  He had been working a temporary job but was hospitalized 1 to 2 years ago secondary to a pneumonia as well as an empyema.  On July 4 he had become intoxicated and then had a fall.  He had hit his head.  He stated that that time he does drink alcohol regularly, but drank more that date than usual.  His blood alcohol at that time was 389.  He was worked up and sutured because of the laceration.  He had a CT scan of the head that was negative.  He was discharged to home.  He admitted to drinking alcohol yesterday.  Unfortunately a blood alcohol was not obtained.  His drug screen was positive for  marijuana.  His MCV is low at 77.3.  He has a history of hypertension and fairly well controlled diabetes mellitus.  He denied any suicidal ideation today, but did complain about disturbed sleep and problems with appetite.  He was admitted to the hospital for evaluation and stabilization.  Continued Clinical Symptoms:  Alcohol Use Disorder Identification Test Final Score (AUDIT): 19 The "Alcohol Use Disorders Identification Test", Guidelines for Use in Primary Care, Second Edition.  World Pharmacologist [facility]). Score between 0-7:  no or low risk or alcohol related problems. Score between 8-15:  moderate risk of alcohol related problems. Score between 16-19:  high risk of alcohol related problems. Score 20 or above:  warrants further diagnostic evaluation for alcohol dependence and treatment.   CLINICAL FACTORS:   Depression:   Anhedonia Comorbid alcohol abuse/dependence Hopelessness Impulsivity Insomnia Alcohol/Substance Abuse/Dependencies   Musculoskeletal: Strength & Muscle Tone: within normal limits Gait & Station: normal Patient leans: N/A  Psychiatric Specialty Exam: Physical Exam  Nursing note and vitals reviewed. Constitutional: He is oriented to person, place, and time. He appears well-developed and well-nourished.  HENT:  Head: Normocephalic.  Respiratory: Effort normal.  Neurological: He is alert and oriented to person, place, and time.    ROS  Blood pressure (!) 123/105, pulse 97, temperature 98.2 F (36.8 C), resp. rate 16, height 5\' 10"  (1.778 m), weight 86.2 kg, SpO2 99 %.Body mass index is 27.26 kg/m.  General Appearance: Disheveled  Eye Contact:  Fair  Speech:  Normal Rate  Volume:  Normal  Mood:  Anxious  Affect:  Congruent  Thought Process:  Coherent and Descriptions of Associations: Circumstantial  Orientation:  Full (Time, Place, and Person)  Thought Content:  Logical  Suicidal Thoughts:  Yes.  without intent/plan  Homicidal Thoughts:  No   Memory:  Immediate;   Fair Recent;   Fair Remote;   Fair  Judgement:  Intact  Insight:  Fair  Psychomotor Activity:  Decreased  Concentration:  Concentration: Fair and Attention Span: Fair  Recall:  FiservFair  Fund of Knowledge:  Fair  Language:  Fair  Akathisia:  Negative  Handed:  Right  AIMS (if indicated):     Assets:  Desire for Improvement Resilience  ADL's:  Intact  Cognition:  WNL  Sleep:  Number of Hours: 6      COGNITIVE FEATURES THAT CONTRIBUTE TO RISK:  None    SUICIDE RISK:   Minimal: No identifiable suicidal ideation.  Patients presenting with no risk factors but with morbid ruminations; may be classified as minimal risk based on the severity of the depressive symptoms  PLAN OF CARE: Patient is seen and examined.  Patient is a 58 year old male with a past psychiatric history significant for major depression versus substance-induced mood disorder and alcohol dependence as well as cannabis use disorder.  He will be admitted to the hospital.  He will be integrated into the milieu.  He will be encouraged to attend groups.  He will be placed on Zoloft 25 mg p.o. daily.  He did state that the medication he received last night for sleep (trazodone) was mildly helpful, but we will increase the dosage up that 200 mg p.o. nightly as needed.  We will continue his hypertensive medication as well as his diabetes medication.  This includes his lisinopril, meloxicam, Lopressor and and amlodipine.  He is on gabapentin for neuropathy, which may be secondary to his alcohol use versus type 2 diabetes.  He will also have available for him hydroxyzine 25 mg p.o. 3 times daily as needed anxiety.  He will also be placed on Librium 25 mg p.o. 4 times daily as needed withdrawal symptoms with a CIWA greater than 10.  He will also be placed on folic acid as well as thiamine for nutritional supplementation.  At this point he has not expressed any interest in treatment for his alcohol dependency, but we  will continue discussed that.  I think a blood alcohol at this point would not be beneficial.  Review of his laboratories revealed a glucose of 152, a mildly elevated ALT of 46, his MCV and MCH as mentioned above.  His hemoglobin A1c is 5.7.  TSH was normal.  CT of the head which was done on 01/07/2019 was reviewed.  His drug screen was positive for marijuana.  I certify that inpatient services furnished can reasonably be expected to improve the patient's condition.   Antonieta PertGreg Lawson Clary, MD 01/22/2019, 9:47 AM

## 2019-01-22 NOTE — Plan of Care (Signed)
Progress note  D: pt found in bed; compliant with medication administration. Pt denies any physical symptoms or pain. Pt is animated and assertive in his approach. Pt's blood pressure and CBG continues to be elevated. Pt denies si/hi/ah/vh and verbally agrees to approach staff if these become apparent or before harming himself/others while at [place]: Pt provided support and encouragement. Pt given medication per protocol and standing orders. Q33m safety checks implemented and continued.  R: Pt safe on the unit. Will continue to monitor.  Pt progressing in the following metrics  Problem: Education: Goal: Knowledge of [facility] Education information/materials will improve Outcome: Progressing Goal: Emotional status will improve Outcome: Progressing Goal: Mental status will improve Outcome: Progressing Goal: Verbalization of understanding the information provided will improve Outcome: Progressing

## 2019-01-22 NOTE — BHH Group Notes (Signed)
LCSW Group Therapy Note 01/22/2019 2:37 PM  Type of Therapy and Topic: Group Therapy: Overcoming Obstacles  Participation Level: Did Not Attend  Description of Group:  In this group patients will be encouraged to explore what they see as obstacles to their own wellness and recovery. They will be guided to discuss their thoughts, feelings, and behaviors related to these obstacles. The group will process together ways to cope with barriers, with attention given to specific choices patients can make. Each patient will be challenged to identify changes they are motivated to make in order to overcome their obstacles. This group will be process-oriented, with patients participating in exploration of their own experiences as well as giving and receiving support and challenge from other group members.  Therapeutic Goals: 1. Patient will identify personal and current obstacles as they relate to admission. 2. Patient will identify barriers that currently interfere with their wellness or overcoming obstacles.  3. Patient will identify feelings, thought process and behaviors related to these barriers. 4. Patient will identify two changes they are willing to make to overcome these obstacles:   Summary of Patient Progress  Invited, chose not to attend.    Therapeutic Modalities:  Cognitive Behavioral Therapy Solution Focused Therapy Motivational Interviewing Relapse Prevention Therapy   Theresa Duty Clinical Social Worker

## 2019-01-22 NOTE — BHH Group Notes (Signed)
Pt did not attend wrap up group this evening. Pt was in bed asleep.  

## 2019-01-22 NOTE — H&P (Signed)
Psychiatric Admission Assessment Adult  Patient Identification: Gerald Wheeler MRN:  [id number] Date of Evaluation:  01/22/2019 Chief Complaint:  MDD,single episode, sev without psychotic features Principal Diagnosis: <principal problem not specified> Diagnosis:  Active Problems:   MDD (major depressive disorder), single episode, severe (HCC)  History of Present Illness: Patient is seen and examined.  Patient is a 58 year old male who presented as a walk-in evaluation to the behavioral health hospital with complaint of suicidal ideation, being depressed, and not knowing what to do.  During the interview today he was having difficulty expressing why he needed to be in the psychiatric hospital.  He stated that he has been out of work for the last several weeks.  He also stated that the weather was extremely hot.  He stated that he had a place to live, but was vague.  He stated that over the last several months he had several physical problems which caused him a great deal of work loss, and why lost his jobs.  He had been working a temporary job but was hospitalized 1 to 2 years ago secondary to a pneumonia as well as an empyema.  On July 4 he had become intoxicated and then had a fall.  He had hit his head.  He stated that that time he does drink alcohol regularly, but drank more that date than usual.  His blood alcohol at that time was 389.  He was worked up and sutured because of the laceration.  He had a CT scan of the head that was negative.  He was discharged to home.  He admitted to drinking alcohol yesterday.  Unfortunately a blood alcohol was not obtained.  His drug screen was positive for marijuana.  His MCV is low at 77.3.  He has a history of hypertension and fairly well controlled diabetes mellitus.  He denied any suicidal ideation today, but did complain about disturbed sleep and problems with appetite.  He was admitted to the hospital for evaluation and stabilization.  Associated  Signs/Symptoms: Depression Symptoms:  depressed mood, anhedonia, insomnia, psychomotor retardation, fatigue, feelings of worthlessness/guilt, difficulty concentrating, hopelessness, suicidal thoughts without plan, anxiety, loss of energy/fatigue, (Hypo) Manic Symptoms:  Impulsivity, Anxiety Symptoms:  Excessive Worry, Psychotic Symptoms:  denied PTSD Symptoms: Negative Total Time spent with patient: 30 minutes  Past Psychiatric History: Patient denied any previous formal psychiatric treatment.  He denied any previous psychiatric admissions, he denied any previous psychiatric medications.  Is the patient at risk to self? Yes.    Has the patient been a risk to self in the past 6 months? No.  Has the patient been a risk to self within the distant past? No.  Is the patient a risk to others? No.  Has the patient been a risk to others in the past 6 months? No.  Has the patient been a risk to others within the distant past? No.   Prior Inpatient Therapy: Prior Inpatient Therapy: No Prior Outpatient Therapy: Prior Outpatient Therapy: No Does patient have an ACCT team?: No Does patient have Intensive In-House Services?  : No Does patient have [facility] services? : No Does patient have P4CC services?: No  Alcohol Screening: 1. How often do you have a drink containing alcohol?: 4 or more times a week 2. How many drinks containing alcohol do you have on a typical day when you are drinking?: 5 or 6 3. How often do you have six or more drinks on one occasion?: Daily or almost daily AUDIT-C Score:  10 4. How often during the last year have you found that you were not able to stop drinking once you had started?: Monthly 5. How often during the last year have you failed to do what was normally expected from you becasue of drinking?: Less than monthly 6. How often during the last year have you needed a first drink in the morning to get yourself going after a heavy drinking session?: Monthly 7.  How often during the last year have you had a feeling of guilt of remorse after drinking?: Never 8. How often during the last year have you been unable to remember what happened the night before because you had been drinking?: Never 9. Have you or someone else been injured as a result of your drinking?: No 10. Has a relative or friend or a doctor or another health worker been concerned about your drinking or suggested you cut down?: Yes, during the last year Alcohol Use Disorder Identification Test Final Score (AUDIT): 19 Alcohol Brief Interventions/Follow-up: Alcohol Education Substance Abuse History in the last 12 months:  Yes.   Consequences of Substance Abuse: Medical Consequences:  : Patient had significant fall with head trauma secondary to intoxication on July 4 of this year. Previous Psychotropic Medications: No  Psychological Evaluations: No  Past Medical History:  Past Medical History:  Diagnosis Date  . Allergy   . Arthritis   . Asthma   . Diabetes mellitus without complication (HCC)   . Essential hypertension 02/27/2016  . High cholesterol   . History of stroke   . Hypertension   . Stroke [facility])     Past Surgical History:  Procedure Laterality Date  . BACK SURGERY    . CYSTECTOMY N/A    cyst removed about 30 years ago  . TONSILLECTOMY  1973  . VIDEO ASSISTED THORACOSCOPY (VATS)/THOROCOTOMY Right 10/22/2016   Procedure: VIDEO ASSISTED THORACOSCOPY (VATS)/THOROCOTOMY for Empyema ;  Surgeon: Alleen Borne, MD;  Location: [facility] OR;  Service: Thoracic;  Laterality: Right;   Family History:  Family History  Problem Relation Age of Onset  . Heart disease Mother   . High blood pressure Mother   . Heart disease Father   . Diabetes Father    Family Psychiatric  History: Denied Tobacco Screening: Have you used any form of tobacco in the last 30 days? (Cigarettes, Smokeless Tobacco, Cigars, and/or Pipes): Yes Tobacco use, Select all that apply: 5 or more cigarettes per day Are  you interested in Tobacco Cessation Medications?: Yes, will notify MD for an order Counseled patient on smoking cessation including recognizing danger situations, developing coping skills and basic information about quitting provided: Refused/Declined practical counseling Social History:  Social History   Substance and Sexual Activity  Alcohol Use Yes   Comment: weekly     Social History   Substance and Sexual Activity  Drug Use Yes  . Frequency: 7.0 times per week  . Types: Marijuana   Comment: Daily use of THC    Additional Social History: Marital status: Long term relationship Long term relationship, how long?: 2 1/2 years What types of issues is patient dealing with in the relationship?: Denies Are you sexually active?: Yes What is your sexual orientation?: Heterosexual Has your sexual activity been affected by drugs, alcohol, medication, or emotional stress?: No Does patient have children?: Yes How many children?: 1 How is patient's relationship with their children?: Reports having a good relationship with his only son.  Allergies:  No Known Allergies Lab Results:  Results for orders placed or performed during the hospital encounter of 01/21/19 (from the past 48 hour(s))  Urine rapid drug screen (hosp performed)not at [facility]     Status: Abnormal   Collection Time: 01/21/19 12:13 PM  Result Value Ref Range   Opiates NONE DETECTED NONE DETECTED   Cocaine NONE DETECTED NONE DETECTED   Benzodiazepines NONE DETECTED NONE DETECTED   Amphetamines NONE DETECTED NONE DETECTED   Tetrahydrocannabinol POSITIVE (A) NONE DETECTED   Barbiturates NONE DETECTED NONE DETECTED    Comment: (NOTE) DRUG SCREEN FOR MEDICAL PURPOSES ONLY.  IF CONFIRMATION IS NEEDED FOR ANY PURPOSE, NOTIFY LAB WITHIN 5 DAYS. LOWEST DETECTABLE LIMITS FOR URINE DRUG SCREEN Drug Class                     Cutoff (ng/mL) Amphetamine and metabolites    1000 Barbiturate and  metabolites    200 Benzodiazepine                 200 Tricyclics and metabolites     300 Opiates and metabolites        300 Cocaine and metabolites        300 THC                            50 Performed at [facility], [address] [place], [place] [address]   SARS Coronavirus 2 (CEPHEID - Performed in [facility] hospital lab), Hosp Order     Status: None   Collection Time: 01/21/19 12:16 PM   Specimen: Nasopharyngeal Swab  Result Value Ref Range   SARS Coronavirus 2 NEGATIVE NEGATIVE    Comment: (NOTE) If result is NEGATIVE SARS-CoV-2 target nucleic acids are NOT DETECTED. The SARS-CoV-2 RNA is generally detectable in upper and lower  respiratory specimens during the acute phase of infection. The lowest  concentration of SARS-CoV-2 viral copies this assay can detect is 250  copies / mL. A negative result does not preclude SARS-CoV-2 infection  and should not be used as the sole basis for treatment or other  patient management decisions.  A negative result may occur with  improper specimen collection / handling, submission of specimen other  than nasopharyngeal swab, presence of viral mutation(s) within the  areas targeted by this assay, and inadequate number of viral copies  (<250 copies / mL). A negative result must be combined with clinical  observations, patient history, and epidemiological information. If result is POSITIVE SARS-CoV-2 target nucleic acids are DETECTED. The SARS-CoV-2 RNA is generally detectable in upper and lower  respiratory specimens dur ing the acute phase of infection.  Positive  results are indicative of active infection with SARS-CoV-2.  Clinical  correlation with patient history and other diagnostic information is  necessary to determine patient infection status.  Positive results do  not rule out bacterial infection or co-infection with other viruses. If result is PRESUMPTIVE POSTIVE SARS-CoV-2 nucleic acids MAY BE PRESENT.    A presumptive positive result was obtained on the submitted specimen  and confirmed on repeat testing.  While 2019 novel coronavirus  (SARS-CoV-2) nucleic acids may be present in the submitted sample  additional confirmatory testing may be necessary for epidemiological  and / or clinical management purposes  to differentiate between  SARS-CoV-2 and other Sarbecovirus currently known to infect humans.  If clinically indicated additional testing with an alternate test  methodology (  [id number]) is advised. The SARS-CoV-2 RNA is generally  detectable in upper and lower respiratory sp ecimens during the acute  phase of infection. The expected result is Negative. Fact Sheet for Patients:  BoilerBrush.com.cyhttps://www.fda.gov/media/136312/download Fact Sheet for Healthcare Providers: https://pope.com/https://www.fda.gov/media/136313/download This test is not yet approved or cleared by the [place] FDA and has been authorized for detection and/or diagnosis of SARS-CoV-2 by FDA under an Emergency Use Authorization (EUA).  This EUA will remain in effect (meaning this test can be used) for the duration of the COVID-19 declaration under Section 564(b)(1) of the Act, 21 U.S.C. section 360bbb-3(b)(1), unless the authorization is terminated or revoked sooner. Performed at [facility], [address] [place], [place] [address]   CBC     Status: Abnormal   Collection Time: 01/22/19  6:31 AM  Result Value Ref Range   WBC 4.5 4.0 - 10.5 K/uL   RBC 5.78 4.22 - 5.81 MIL/uL   Hemoglobin 13.9 13.0 - 17.0 g/dL   HCT 57.844.7 46.939.0 - 62.952.0 %   MCV 77.3 (L) 80.0 - 100.0 fL   MCH 24.0 (L) 26.0 - 34.0 pg   MCHC 31.1 30.0 - 36.0 g/dL   RDW 52.818.1 (H) 41.311.5 - 24.415.5 %   Platelets 201 150 - 400 K/uL   nRBC 0.0 0.0 - 0.2 %    Comment: Performed at [facility], [address] [place], [place] [address]  Comprehensive metabolic panel     Status: Abnormal   Collection Time: 01/22/19  6:31 AM  Result Value Ref  Range   Sodium 135 135 - 145 mmol/L   Potassium 3.5 3.5 - 5.1 mmol/L   Chloride 101 98 - 111 mmol/L   CO2 20 (L) 22 - 32 mmol/L   Glucose, Bld 152 (H) 70 - 99 mg/dL   BUN 6 6 - 20 mg/dL   Creatinine, Ser 2.530.71 0.61 - 1.24 mg/dL   Calcium 8.9 8.9 - 66.410.3 mg/dL   Total Protein 7.6 6.5 - 8.1 g/dL   Albumin 3.9 3.5 - 5.0 g/dL   AST 40 15 - 41 U/L   ALT 46 (H) 0 - 44 U/L   Alkaline Phosphatase 50 38 - 126 U/L   Total Bilirubin 0.4 0.3 - 1.2 mg/dL   GFR calc non Af Amer >60 >60 mL/min   GFR calc Af Amer >60 >60 mL/min   Anion gap 14 5 - 15    Comment: Performed at [facility], [address] [place], [place] [address]  Hemoglobin A1c     Status: Abnormal   Collection Time: 01/22/19  6:31 AM  Result Value Ref Range   Hgb A1c MFr Bld 5.7 (H) 4.8 - 5.6 %    Comment: (NOTE) Pre diabetes:          5.7%-6.4% Diabetes:              >6.4% Glycemic control for   <7.0% adults with diabetes    Mean Plasma Glucose 116.89 mg/dL    Comment: Performed at [facility], [address] [place], [place] [address]  TSH     Status: None   Collection Time: 01/22/19  6:31 AM  Result Value Ref Range   TSH 3.061 0.350 - 4.500 uIU/mL    Comment: Performed by a 3rd Generation assay with a functional sensitivity of <=0.01 uIU/mL. Performed at [facility], [address] [place], [place] [address]     Blood Alcohol level:  Lab Results  Component Value Date   POE [address] [facility]) 53/61/4431    Metabolic Disorder Labs:  Lab Results  Component Value Date   HGBA1C 5.7 (H) 01/22/2019   MPG 116.89 01/22/2019   No results found for: PROLACTIN No results found for: CHOL, TRIG, HDL, CHOLHDL, VLDL, LDLCALC  Current Medications: Current Facility-Administered Medications  Medication Dose Route Frequency Provider Last Rate Last Dose  . acetaminophen (TYLENOL) tablet 650 mg  650 mg Oral Q6H PRN Money, Lowry Ram, FNP      . alum & mag hydroxide-simeth (MAALOX/MYLANTA)  200-200-20 MG/5ML suspension 30 mL  30 mL Oral Q4H PRN Money, Darnelle Maffucci B, FNP      . amLODipine (NORVASC) tablet 5 mg  5 mg Oral Daily Sharma Covert, MD      . atorvastatin (LIPITOR) tablet 40 mg  40 mg Oral q1800 Money, Lowry Ram, FNP   40 mg at 01/21/19 1816  . chlordiazePOXIDE (LIBRIUM) capsule 25 mg  25 mg Oral QID PRN Sharma Covert, MD      . folic acid (FOLVITE) tablet 1 mg  1 mg Oral Daily Sharma Covert, MD      . gabapentin (NEURONTIN) tablet 600 mg  600 mg Oral TID Money, Lowry Ram, FNP   600 mg at 01/22/19 0756  . hydrOXYzine (ATARAX/VISTARIL) tablet 25 mg  25 mg Oral TID PRN Money, Lowry Ram, FNP      . lisinopril (ZESTRIL) tablet 40 mg  40 mg Oral Daily Money, Lowry Ram, FNP   40 mg at 01/22/19 0756  . magnesium hydroxide (MILK OF MAGNESIA) suspension 30 mL  30 mL Oral Daily PRN Money, Lowry Ram, FNP      . meloxicam (MOBIC) tablet 15 mg  15 mg Oral Daily Money, Lowry Ram, FNP   15 mg at 01/22/19 0756  . metoprolol tartrate (LOPRESSOR) tablet 25 mg  25 mg Oral Daily Money, Lowry Ram, FNP   25 mg at 01/22/19 0756  . nicotine (NICODERM CQ - dosed in mg/24 hours) patch 21 mg  21 mg Transdermal Daily Money, Lowry Ram, FNP   21 mg at 01/22/19 0756  . sertraline (ZOLOFT) tablet 25 mg  25 mg Oral Daily Sharma Covert, MD      . thiamine (VITAMIN B-1) tablet 100 mg  100 mg Oral Daily Sharma Covert, MD      . traZODone (DESYREL) tablet 100 mg  100 mg Oral QHS PRN Sharma Covert, MD       PTA Medications: Medications Prior to Admission  Medication Sig Dispense Refill Last Dose  . atorvastatin (LIPITOR) 40 MG tablet Take 1 tablet by mouth daily.   Past Week at Unknown time  . gabapentin (NEURONTIN) 600 MG tablet Take 600 mg by mouth 3 (three) times daily.   Past Week at Unknown time  . lisinopril (PRINIVIL,ZESTRIL) 40 MG tablet Take 1 tablet (40 mg total) by mouth daily. 30 tablet 0 Past Week at Unknown time  . meloxicam (MOBIC) 15 MG tablet Take 1 tablet by mouth daily.    Past Week at Unknown time  . metoprolol tartrate (LOPRESSOR) 25 MG tablet Take 25 mg by mouth daily.   Past Week at Unknown time    Musculoskeletal: Strength & Muscle Tone: within normal limits Gait & Station: normal Patient leans: N/A  Psychiatric Specialty Exam: Physical Exam  Nursing note and vitals reviewed. Constitutional: He is oriented to person, place, and time. He appears well-developed and well-nourished.  HENT:  Head: Normocephalic.  Respiratory: Effort normal.  Neurological: He is alert and oriented to person, place, and time.    ROS  Blood pressure (!) 123/105, pulse 97, temperature 98.2 F (36.8 C), resp. rate 16, height 5\' 10"  (1.778 m), weight 86.2 kg, SpO2 99 %.Body mass index is 27.26 kg/m.  General Appearance: Disheveled  Eye Contact:  Fair  Speech:  Normal Rate  Volume:  Decreased  Mood:  Anxious and Depressed  Affect:  Congruent  Thought Process:  Coherent and Descriptions of Associations: Circumstantial  Orientation:  Full (Time, Place, and Person)  Thought Content:  Logical  Suicidal Thoughts:  No  Homicidal Thoughts:  No  Memory:  Immediate;   Fair Recent;   Fair Remote;   Fair  Judgement:  Impaired  Insight:  Lacking  Psychomotor Activity:  Decreased  Concentration:  Concentration: Fair and Attention Span: Fair  Recall:  [employer]  Fund of Knowledge:  Fair  Language:  Fair  Akathisia:  Negative  Handed:  Right  AIMS (if indicated):     Assets:  Desire for Improvement Resilience  ADL's:  Intact  Cognition:  WNL  Sleep:  Number of Hours: 6    Treatment Plan Summary: Daily contact with patient to assess and evaluate symptoms and progress in treatment, Medication management and Plan : Patient is seen and examined.  Patient is a 58 year old male with a past psychiatric history significant for major depression versus substance-induced mood disorder and alcohol dependence as well as cannabis use disorder.  He will be admitted to the hospital.  He  will be integrated into the milieu.  He will be encouraged to attend groups.  He will be placed on Zoloft 25 mg p.o. daily.  He did state that the medication he received last night for sleep (trazodone) was mildly helpful, but we will increase the dosage up that 200 mg p.o. nightly as needed.  We will continue his hypertensive medication as well as his diabetes medication.  This includes his lisinopril, meloxicam, Lopressor and and amlodipine.  He is on gabapentin for neuropathy, which may be secondary to his alcohol use versus type 2 diabetes.  He will also have available for him hydroxyzine 25 mg p.o. 3 times daily as needed anxiety.  He will also be placed on Librium 25 mg p.o. 4 times daily as needed withdrawal symptoms with a CIWA greater than 10.  He will also be placed on folic acid as well as thiamine for nutritional supplementation.  At this point he has not expressed any interest in treatment for his alcohol dependency, but we will continue discussed that.  I think a blood alcohol at this point would not be beneficial.  Review of his laboratories revealed a glucose of 152, a mildly elevated ALT of 46, his MCV and MCH as mentioned above.  His hemoglobin A1c is 5.7.  TSH was normal.  CT of the head which was done on 01/07/2019 was reviewed.  His drug screen was positive for marijuana.  Observation Level/Precautions:  Detox 15 minute checks  Laboratory:  Chemistry Profile  Psychotherapy:    Medications:    Consultations:    Discharge Concerns:    Estimated LOS:  Other:     Physician Treatment Plan for Primary Diagnosis: <principal problem not specified> Long Term Goal(s): Improvement in symptoms so as ready for discharge  Short Term Goals: Ability to identify changes in lifestyle to reduce recurrence of condition will improve, Ability to verbalize feelings will improve, Ability  to disclose and discuss suicidal ideas, Ability to demonstrate self-control will improve, Ability to identify and  develop effective coping behaviors will improve, Ability to maintain clinical measurements within normal limits will improve and Ability to identify triggers associated with substance abuse/mental health issues will improve  Physician Treatment Plan for Secondary Diagnosis: Active Problems:   MDD (major depressive disorder), single episode, severe (HCC)  Long Term Goal(s): Improvement in symptoms so as ready for discharge  Short Term Goals: Ability to identify changes in lifestyle to reduce recurrence of condition will improve, Ability to verbalize feelings will improve, Ability to disclose and discuss suicidal ideas, Ability to demonstrate self-control will improve, Ability to identify and develop effective coping behaviors will improve, Ability to maintain clinical measurements within normal limits will improve and Ability to identify triggers associated with substance abuse/mental health issues will improve  I certify that inpatient services furnished can reasonably be expected to improve the patient's condition.    Antonieta Pert, MD 7/20/202011:41 AM

## 2019-01-22 NOTE — [facility] Counselor (Signed)
Adult Comprehensive Assessment  Patient ID: Fernanda Drumrthur R Dondlinger, male   DOB: 1960-08-03, 58 y.o.   MRN: [id number]  Information Source: Information source: Patient  Current Stressors:  Patient states their primary concerns and needs for treatment are:: "I guess I'm depressed been out of work a couple weeks or so and I been struggling" Patient states their goals for this hospitilization and ongoing recovery are:: "No I'm leaving in the morning" Educational / Learning stressors: N/A Employment / Job issues: Reports he was laid off earlier this month; Reports he struggles with unemployment Family Relationships: Denies any current stressors Surveyor, quantityinancial / Lack of resources (include bankruptcy): Financial strain; No income currently. Patient reports he is currently looking for employment Housing / Lack of housing: [place], [place]; Denies any stressors, however states he does plan to find a new home when he is able to obtain new employment Physical health (include injuries & life threatening diseases): Denies any current stressors Social relationships: Denies any current stressors Substance abuse: Reports drinking ETOH daily; States drinking a beer once a day. He also endorses smoking cannabis once a week. Denies any current stressors Bereavement / Loss: Denies any current stressors  Living/Environment/Situation:  Living Arrangements: Spouse/significant other Living conditions (as described by patient or guardian): "It's alright, it could be better" Who else lives in the home?: Girlfriend and girlfriend's daughter and granddaughter How long has patient lived in current situation?: 2 1/2 years What is atmosphere in current home: Temporary  Family History:  Marital status: Long term relationship Long term relationship, how long?: 2 1/2 years What types of issues is patient dealing with in the relationship?: Denies Are you sexually active?: Yes What is your sexual orientation?:  Heterosexual Has your sexual activity been affected by drugs, alcohol, medication, or emotional stress?: No Does patient have children?: Yes How many children?: 1 How is patient's relationship with their children?: Reports having a good relationship with his only son.  Childhood History:  By whom was/is the patient raised?: Mother, Father Additional childhood history information: Reports his father passed away when he was 18yo. Description of patient's relationship with caregiver when they were a child: Reports having a good relationship with both parents during his childhood. Patient's description of current relationship with people who raised him/her: Reports he continues to have a good relationship with his mother. Father is currently deceased. How were you disciplined when you got in trouble as a child/adolescent?: Verbally Does patient have siblings?: Yes Number of Siblings: 2 Description of patient's current relationship with siblings: Reports having a good relationship with his older brother and younger sister. Did patient suffer any verbal/emotional/physical/sexual abuse as a child?: No Did patient suffer from severe childhood neglect?: No Has patient ever been sexually abused/assaulted/raped as an adolescent or adult?: No Was the patient ever a victim of a crime or a disaster?: No Witnessed domestic violence?: No Has patient been effected by domestic violence as an adult?: No  Education:  Highest grade of school patient has completed: Some college Currently a Consulting civil engineerstudent?: No Learning disability?: No  Employment/Work Situation:   Employment situation: Unemployed Patient's job has been impacted by current illness: No What is the longest time patient has a held a job?: 6 years 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 If attempted suicide, did drugs/alcohol play a role in this?: No Alcohol/Substance Abuse Treatment Hx: Denies past history Has alcohol/substance abuse ever caused legal problems?: No  Social Support System:   Patient's Community Support System: Good Describe Community Support System: "Family" Type of faith/religion: Baptist How does patient's faith help to cope with current illness?: Prayer  Leisure/Recreation:   Leisure and Hobbies: Passenger transport manager, bowling"  Strengths/Needs:   What is the patient's perception of their strengths?: "I'm helpful" Patient states they can use these personal strengths during their treatment to contribute to their recovery: Yes Patient states these barriers may affect/interfere with their treatment: No Patient states these barriers may affect their return to the community: No Other important information patient would like considered in planning for their treatment: No  Discharge Plan:   Currently receiving community mental health services: No Patient states concerns and preferences for aftercare planning are: Patient declin Does patient have access to transportation?: Yes Does patient have financial barriers related to discharge medications?: Yes Patient description of barriers related to discharge medications: No income and no health insurance Will patient be returning to same living situation after discharge?: Yes  Summary/Recommendations:   Summary and Recommendations (to be completed by the evaluator): Kelechi is a 58 year old male who is diagnosed with Major Depressive Disorder, Single Ep., Severe w/o psychotic features. He presented to the  hospital seeking treatment for suicidal ideation. During the assessment, Tresean was pleasant and cooperative with providing information. Danel reports that he was recently laid off from his jo earlier this month and has struggled coping. Zacharey states that he would like to discharge as soon as possible so that he can continue to search for new employment. He denied any suicidal ideation during this assessment. Jejuan declined any outpatient referral for continuity of care at this time. CSW will continue to follow. Asir can benefit from crisis stabilization, medication management, therapeutic milieu and referral services.  Marylee Floras. 01/22/2019

## 2019-01-22 NOTE — [facility] Suicide Risk Assessment (Signed)
Central INPATIENT:  Family/Significant Other Suicide Prevention Education  Suicide Prevention Education:  Patient Refusal for Family/Significant Other Suicide Prevention Education: The patient Gerald Wheeler has refused to provide written consent for family/significant other to be provided Family/Significant Other Suicide Prevention Education during admission and/or prior to discharge.  Physician notified.   SPE completed with patient, as patient refused to consent to family contact. SPI pamphlet provided to pt and pt was encouraged to share information with support network, ask questions, and talk about any concerns relating to SPE. Patient denies access to guns/firearms and verbalized understanding of information provided. Mobile Crisis information also provided to patient.   Marylee Floras 01/22/2019, 10:54 AM

## 2019-01-23 DIAGNOSIS — F322 Major depressive disorder, single episode, severe without psychotic features: Secondary | ICD-10-CM

## 2019-01-23 MED ORDER — SERTRALINE HCL 50 MG PO TABS
50.0000 mg | ORAL_TABLET | Freq: Every day | ORAL | Status: DC
  Administered 2019-01-24: 50 mg via ORAL
  Filled 2019-01-23 (×2): qty 1

## 2019-01-23 NOTE — Progress Notes (Signed)
Adult Psychoeducational Group Note  Date:  01/23/2019 Time:  9:47 PM  Group Topic/Focus:  Wrap-Up Group:   The focus of this group is to help patients review their daily goal of treatment and discuss progress on daily workbooks.  Participation Level:  Active  Participation Quality:  Appropriate  Affect:  Appropriate  Cognitive:  Alert  Insight: Appropriate  Engagement in Group:  Engaged  Modes of Intervention:  Discussion  Additional Comments:  Pt stated goal for today was to keep his mind at peace. Pt stated that he was able to achieve that goal. One new coping skill that the pt has learned is to have self-control.   Sharmon Revere 01/23/2019, 9:47 PM

## 2019-01-23 NOTE — Progress Notes (Signed)
[facility] MD Progress Note  01/23/2019 9:03 AM Gerald Wheeler  MRN:  [id number] Subjective: Patient reports "I am feeling okay".  At this time denies suicidal ideations.  Does not endorse significant symptoms of alcohol withdrawal and denies feeling diaphoretic or tremulous or restless.  Currently presents future oriented and expressing hope to be discharged soon in order to return to work.  Does not currently endorse medication side effects.  Objective: I reviewed case with treatment team and have met with patient. 58 year old male, admitted with depression, suicidal ideations.  He reported stressors including unstable work and the weather being very hot.  He reported a history of head trauma on July 4 related to alcohol intoxication at the time.  A head CT scan at that time was negative. Today patient reports feeling better.  Currently denies suicidal ideations and presents future oriented.  States he is hopeful for discharge soon as he needs to "get back to work".  He explains he is able to find work through [employer] and knows that they are "currently hiring".  He also states he plans to go live with a family member.  He remains vague regarding factors that led to this admission, does state that "it was very hot outside", and states that he has a prior history of "heat stroke".  He endorses alcohol use in binges.  Currently he is not presenting with symptoms of alcohol withdrawal.  No tremors, no diaphoresis, no visual disturbances, no psychomotor agitation, BP 124/94.  Pulse 102.  Of note, denies any history of seizures.  Behavior on unit calm and in good control.  Pleasant and cooperative on approach.  Currently denies medication side effects.  Denies any suicidal ideations and as above presents future oriented at this time.  Principal Problem: Depression Diagnosis: Active Problems:   MDD (major depressive disorder), single episode, severe ([place])  Total Time spent with patient: 20  minutes  Past Psychiatric History:   Past Medical History:  Past Medical History:  Diagnosis Date  . Allergy   . Arthritis   . Asthma   . Diabetes mellitus without complication ([place])   . Essential hypertension 02/27/2016  . High cholesterol   . History of stroke   . Hypertension   . Stroke [facility])     Past Surgical History:  Procedure Laterality Date  . BACK SURGERY    . CYSTECTOMY N/A    cyst removed about 30 years ago  . TONSILLECTOMY  1973  . VIDEO ASSISTED THORACOSCOPY (VATS)/THOROCOTOMY Right 10/22/2016   Procedure: VIDEO ASSISTED THORACOSCOPY (VATS)/THOROCOTOMY for Empyema ;  Surgeon: Gaye Pollack, MD;  Location: [facility] [place] OR;  Service: Thoracic;  Laterality: Right;   Family History:  Family History  Problem Relation Age of Onset  . Heart disease Mother   . High blood pressure Mother   . Heart disease Father   . Diabetes Father    Family Psychiatric  History:  Social History:  Social History   Substance and Sexual Activity  Alcohol Use Yes   Comment: weekly     Social History   Substance and Sexual Activity  Drug Use Yes  . Frequency: 7.0 times per week  . Types: Marijuana   Comment: Daily use of THC    Social History   Socioeconomic History  . Marital status: Significant Other    Spouse name: Not on file  . Number of children: Not on file  . Years of education: Not on file  . Highest education level: Not on  file  Occupational History  . Occupation: Currently unemployed  Social Needs  . Financial resource strain: Not on file  . Food insecurity    Worry: Not on file    Inability: Not on file  . Transportation needs    Medical: Not on file    Non-medical: Not on file  Tobacco Use  . Smoking status: Current Every Day Smoker    Types: Cigarettes  . Smokeless tobacco: Never Used  Substance and Sexual Activity  . Alcohol use: Yes    Comment: weekly  . Drug use: Yes    Frequency: 7.0 times per week    Types: Marijuana    Comment: Daily use of THC   . Sexual activity: Yes  Lifestyle  . Physical activity    Days per week: Not on file    Minutes per session: Not on file  . Stress: Not on file  Relationships  . Social Herbalist on phone: Not on file    Gets together: Not on file    Attends religious service: Not on file    Active member of club or organization: Not on file    Attends meetings of clubs or organizations: Not on file    Relationship status: Not on file  Other Topics Concern  . Not on file  Social History Narrative   Pt lives in [place] with his girlfriend.  He is currently unemployed.  He does not have a psychiatrist.   Additional Social History:  Sleep: Fair  Appetite:  Good  Current Medications: Current Facility-Administered Medications  Medication Dose Route Frequency Provider Last Rate Last Dose  . acetaminophen (TYLENOL) tablet 650 mg  650 mg Oral Q6H PRN Money, Lowry Ram, FNP      . alum & mag hydroxide-simeth (MAALOX/MYLANTA) 200-200-20 MG/5ML suspension 30 mL  30 mL Oral Q4H PRN Money, Darnelle Maffucci B, FNP      . amLODipine (NORVASC) tablet 5 mg  5 mg Oral Daily Sharma Covert, MD   5 mg at 01/23/19 0741  . atorvastatin (LIPITOR) tablet 40 mg  40 mg Oral q1800 Money, Lowry Ram, FNP   40 mg at 01/22/19 1754  . chlordiazePOXIDE (LIBRIUM) capsule 25 mg  25 mg Oral QID PRN Sharma Covert, MD      . folic acid (FOLVITE) tablet 1 mg  1 mg Oral Daily Sharma Covert, MD   1 mg at 01/23/19 0741  . gabapentin (NEURONTIN) tablet 600 mg  600 mg Oral TID Money, Lowry Ram, FNP   600 mg at 01/23/19 0741  . hydrOXYzine (ATARAX/VISTARIL) tablet 25 mg  25 mg Oral TID PRN Money, Lowry Ram, FNP   25 mg at 01/22/19 2302  . lisinopril (ZESTRIL) tablet 40 mg  40 mg Oral Daily Money, Lowry Ram, FNP   40 mg at 01/23/19 0741  . magnesium hydroxide (MILK OF MAGNESIA) suspension 30 mL  30 mL Oral Daily PRN Money, Lowry Ram, FNP      . meloxicam (MOBIC) tablet 15 mg  15 mg Oral Daily Money, Lowry Ram, FNP   15 mg at  01/23/19 0741  . metoprolol tartrate (LOPRESSOR) tablet 25 mg  25 mg Oral Daily Money, Lowry Ram, FNP   25 mg at 01/23/19 0740  . nicotine (NICODERM CQ - dosed in mg/24 hours) patch 21 mg  21 mg Transdermal Daily Money, Lowry Ram, FNP   21 mg at 01/23/19 0738  . sertraline (ZOLOFT) tablet 25 mg  25  mg Oral Daily Sharma Covert, MD   25 mg at 01/23/19 0744  . thiamine (VITAMIN B-1) tablet 100 mg  100 mg Oral Daily Sharma Covert, MD   100 mg at 01/23/19 0741  . traZODone (DESYREL) tablet 100 mg  100 mg Oral QHS PRN Sharma Covert, MD   100 mg at 01/22/19 2302    Lab Results:  Results for orders placed or performed during the hospital encounter of 01/21/19 (from the past 48 hour(s))  Urine rapid drug screen (hosp performed)not at [facility]     Status: Abnormal   Collection Time: 01/21/19 12:13 PM  Result Value Ref Range   Opiates NONE DETECTED NONE DETECTED   Cocaine NONE DETECTED NONE DETECTED   Benzodiazepines NONE DETECTED NONE DETECTED   Amphetamines NONE DETECTED NONE DETECTED   Tetrahydrocannabinol POSITIVE (A) NONE DETECTED   Barbiturates NONE DETECTED NONE DETECTED    Comment: (NOTE) DRUG SCREEN FOR MEDICAL PURPOSES ONLY.  IF CONFIRMATION IS NEEDED FOR ANY PURPOSE, NOTIFY LAB WITHIN 5 DAYS. LOWEST DETECTABLE LIMITS FOR URINE DRUG SCREEN Drug Class                     Cutoff (ng/mL) Amphetamine and metabolites    1000 Barbiturate and metabolites    200 Benzodiazepine                 656 Tricyclics and metabolites     300 Opiates and metabolites        300 Cocaine and metabolites        300 THC                            50 Performed at [facility], [place] [address], [place], [place] [address]   SARS Coronavirus 2 (CEPHEID - Performed in [place] hospital lab), Hosp Order     Status: None   Collection Time: 01/21/19 12:16 PM   Specimen: Nasopharyngeal Swab  Result Value Ref Range   SARS Coronavirus 2 NEGATIVE NEGATIVE    Comment: (NOTE) If  result is NEGATIVE SARS-CoV-2 target nucleic acids are NOT DETECTED. The SARS-CoV-2 RNA is generally detectable in upper and lower  respiratory specimens during the acute phase of infection. The lowest  concentration of SARS-CoV-2 viral copies this assay can detect is 250  copies / mL. A negative result does not preclude SARS-CoV-2 infection  and should not be used as the sole basis for treatment or other  patient management decisions.  A negative result may occur with  improper specimen collection / handling, submission of specimen other  than nasopharyngeal swab, presence of viral mutation(s) within the  areas targeted by this assay, and inadequate number of viral copies  (<250 copies / mL). A negative result must be combined with clinical  observations, patient history, and epidemiological information. If result is POSITIVE SARS-CoV-2 target nucleic acids are DETECTED. The SARS-CoV-2 RNA is generally detectable in upper and lower  respiratory specimens dur ing the acute phase of infection.  Positive  results are indicative of active infection with SARS-CoV-2.  Clinical  correlation with patient history and other diagnostic information is  necessary to determine patient infection status.  Positive results do  not rule out bacterial infection or co-infection with other viruses. If result is PRESUMPTIVE POSTIVE SARS-CoV-2 nucleic acids MAY BE PRESENT.   A presumptive positive result was obtained on the submitted specimen  and confirmed on repeat  testing.  While 2019 novel coronavirus  (SARS-CoV-2) nucleic acids may be present in the submitted sample  additional confirmatory testing may be necessary for epidemiological  and / or clinical management purposes  to differentiate between  SARS-CoV-2 and other Sarbecovirus currently known to infect humans.  If clinically indicated additional testing with an alternate test  methodology [phone]) is advised. The SARS-CoV-2 RNA is generally   detectable in upper and lower respiratory sp ecimens during the acute  phase of infection. The expected result is Negative. Fact Sheet for Patients:  StrictlyIdeas.no Fact Sheet for Healthcare Providers: BankingDealers.co.za This test is not yet approved or cleared by the [place] FDA and has been authorized for detection and/or diagnosis of SARS-CoV-2 by FDA under an Emergency Use Authorization (EUA).  This EUA will remain in effect (meaning this test can be used) for the duration of the COVID-19 declaration under Section 564(b)(1) of the Act, 21 U.S.C. section 360bbb-3(b)(1), unless the authorization is terminated or revoked sooner. Performed at [facility], [place] [address], [place], [place] [address]   CBC     Status: Abnormal   Collection Time: 01/22/19  6:31 AM  Result Value Ref Range   WBC 4.5 4.0 - 10.5 K/uL   RBC 5.78 4.22 - 5.81 MIL/uL   Hemoglobin 13.9 13.0 - 17.0 g/dL   HCT 44.7 39.0 - 52.0 %   MCV 77.3 (L) 80.0 - 100.0 fL   MCH 24.0 (L) 26.0 - 34.0 pg   MCHC 31.1 30.0 - 36.0 g/dL   RDW 18.1 (H) 11.5 - 15.5 %   Platelets 201 150 - 400 K/uL   nRBC 0.0 0.0 - 0.2 %    Comment: Performed at [facility], [place] [address] [place], [place] [address]  Comprehensive metabolic panel     Status: Abnormal   Collection Time: 01/22/19  6:31 AM  Result Value Ref Range   Sodium 135 135 - 145 mmol/L   Potassium 3.5 3.5 - 5.1 mmol/L   Chloride 101 98 - 111 mmol/L   CO2 20 (L) 22 - 32 mmol/L   Glucose, Bld 152 (H) 70 - 99 mg/dL   BUN 6 6 - 20 mg/dL   Creatinine, Ser 0.71 0.61 - 1.24 mg/dL   Calcium 8.9 8.9 - 10.3 mg/dL   Total Protein 7.6 6.5 - 8.1 g/dL   Albumin 3.9 3.5 - 5.0 g/dL   AST 40 15 - 41 U/L   ALT 46 (H) 0 - 44 U/L   Alkaline Phosphatase 50 38 - 126 U/L   Total Bilirubin 0.4 0.3 - 1.2 mg/dL   GFR calc non Af Amer >60 >60 mL/min   GFR calc Af Amer >60 >60 mL/min   Anion gap  14 5 - 15    Comment: Performed at [facility], [place] [address], [place], [place] [address]  Hemoglobin A1c     Status: Abnormal   Collection Time: 01/22/19  6:31 AM  Result Value Ref Range   Hgb A1c MFr Bld 5.7 (H) 4.8 - 5.6 %    Comment: (NOTE) Pre diabetes:          5.7%-6.4% Diabetes:              >6.4% Glycemic control for   <7.0% adults with diabetes    Mean Plasma Glucose 116.89 mg/dL    Comment: Performed at [place] [address], [place], [place] [address]  TSH     Status:  None   Collection Time: 01/22/19  6:31 AM  Result Value Ref Range   TSH 3.061 0.350 - 4.500 uIU/mL    Comment: Performed by a 3rd Generation assay with a functional sensitivity of <=0.01 uIU/mL. Performed at [facility], [place] [address] [place], [place] [address]     Blood Alcohol level:  Lab Results  Component Value Date   ETH [facility]) 81/27/5170    Metabolic Disorder Labs: Lab Results  Component Value Date   HGBA1C 5.7 (H) 01/22/2019   MPG 116.89 01/22/2019   No results found for: PROLACTIN No results found for: CHOL, TRIG, HDL, CHOLHDL, VLDL, LDLCALC  Physical Findings: AIMS: Facial and Oral Movements Muscles of Facial Expression: None, normal Lips and Perioral Area: None, normal Jaw: None, normal Tongue: None, normal,Extremity Movements Upper (arms, wrists, hands, fingers): None, normal Lower (legs, knees, ankles, toes): None, normal, Trunk Movements Neck, shoulders, hips: None, normal, Overall Severity Severity of abnormal movements (highest score from questions above): None, normal Incapacitation due to abnormal movements: None, normal Patient's awareness of abnormal movements (rate only patient's report): No Awareness, Dental Status Current problems with teeth and/or dentures?: No Does patient usually wear dentures?: No  CIWA:    COWS:     Musculoskeletal: Strength & Muscle Tone: within normal limits-no tremors, no  diaphoresis, no psychomotor agitation Gait & Station: normal Patient leans: N/A  Psychiatric Specialty Exam: Physical Exam  ROS denies headache at this time, no visual disturbances, no chest pain or shortness of breath, no cough, no fever  Blood pressure (!) 124/94, pulse (!) 102, temperature 98.2 F (36.8 C), temperature source Oral, resp. rate 16, height _0  (1.778 m), weight 86.2 kg, SpO2 99 %.Body mass index is 27.26 kg/m.  General Appearance: Fairly Groomed  Eye Contact:  Fair-improves during session  Speech:  Normal Rate  Volume:  Normal  Mood:  Improving mood, states he feels "better", currently minimizes depression  Affect:  Appropriate and Smiles at times appropriately during session  Thought Process:  Linear and Descriptions of Associations: Intact  Orientation:  Other:  Fully alert and attentive  Thought Content:  Currently denies hallucinations and does not appear internally preoccupied, no delusions expressed  Suicidal Thoughts:  No at this time denies suicidal or self-injurious ideations, denies homicidal or violent ideations  Homicidal Thoughts:  No  Memory:  Recent and remote grossly intact  Judgement:  Fair/improving  Insight:  Fair/improving  Psychomotor Activity:  Normal  Concentration:  Concentration: Good and Attention Span: Good  Recall:  Good  Fund of Knowledge:  Good  Language:  Good  Akathisia:  Negative  Handed:  Right  AIMS (if indicated):     Assets:  Desire for Improvement Resilience  ADL's:  Intact  Cognition:  WNL  Sleep:  Number of Hours: 6.25   Assessment:  58 year old male, admitted with depression, suicidal ideations.  He reported stressors including unstable work and the weather being very hot.  He reported a history of head trauma on July 4 related to alcohol intoxication at the time.  A head CT scan at that time was negative.  Currently patient reports feeling better than he did on admission.  Denies significant depression today,  states he is feeling better.  Presents future oriented and hopeful for discharge soon in order to return to work.  Denies/does not endorse symptoms of alcohol withdrawal and does not appear to be in any acute distress or discomfort.  Currently tolerating medications well  Treatment Plan  Summary: Daily contact with patient to assess and evaluate symptoms and progress in treatment, Medication management, Plan Inpatient treatment and Medications as below  Encourage group and milieu participation Encourage efforts to work on sobriety and abstinence Continue Librium PRN for alcohol withdrawal as needed Continue Neurontin 600 mg 3 times daily for anxiety/pain Increase Zoloft to 50 mg daily for depression Continue trazodone 100 mg nightly PRN for insomnia Continue Lopressor/lisinopril/amlodipine for management of hypertension Continue Lipitor for hypercholesterolemia We discussed acamprosate trial as an option but currently declines Treatment team working on disposition planning options  Jenne Campus, MD 01/23/2019, 9:03 AM

## 2019-01-23 NOTE — Plan of Care (Signed)
Progress note  D: pt found in bed; compliant with medication administration. Pt had complaints of insomnia. Pt denies any other complaints. Pt is animated and conversing more with staff and other patients. Pt denies si/hi/ah/vh and verbally agrees to approach staff if these become apparent or before harming himself/others while at [place]: Pt provided support and encouragement. Pt given medication per protocol and standing orders. Q45m safety checks implemented and continued.  R: Pt safe on the unit. Will continue to monitor.  Pt progressing in the following metrics  Problem: Activity: Goal: Interest or engagement in activities will improve Outcome: Progressing   Problem: Coping: Goal: Ability to verbalize frustrations and anger appropriately will improve Outcome: Progressing Goal: Ability to demonstrate self-control will improve Outcome: Progressing   Problem: Health Behavior/Discharge Planning: Goal: Identification of resources available to assist in meeting health care needs will improve Outcome: Progressing

## 2019-01-23 NOTE — Progress Notes (Signed)
D: Pt denies SI/HI/AVH. Pt is pleasant and cooperative. Pt stated he felt better, chance to get self together A: Pt was offered support and encouragement. Pt was given scheduled medications. Pt was encourage to attend groups. Q 15 minute checks were done for safety.  R:Pt attends groups and interacts well with peers and staff. Pt is taking medication. Pt has no complaints.Pt receptive to treatment and safety maintained on unit.

## 2019-01-24 MED ORDER — TRAZODONE HCL 100 MG PO TABS: 100.0000 mg | ORAL_TABLET | Freq: Every evening | ORAL | 0 refills | Status: AC | PRN

## 2019-01-24 MED ORDER — NICOTINE 21 MG/24HR TD PT24: 21.0000 mg | MEDICATED_PATCH | Freq: Every day | TRANSDERMAL | 0 refills | Status: AC

## 2019-01-24 MED ORDER — AMLODIPINE BESYLATE 5 MG PO TABS: 5.0000 mg | ORAL_TABLET | Freq: Every day | ORAL | 0 refills | Status: AC

## 2019-01-24 MED ORDER — MELOXICAM 15 MG PO TABS: 15.0000 mg | ORAL_TABLET | Freq: Every day | ORAL | 0 refills | Status: AC

## 2019-01-24 MED ORDER — METOPROLOL TARTRATE 25 MG PO TABS: 25.0000 mg | ORAL_TABLET | Freq: Every day | ORAL | 0 refills | Status: AC

## 2019-01-24 MED ORDER — GABAPENTIN 600 MG PO TABS: 600.0000 mg | ORAL_TABLET | Freq: Three times a day (TID) | ORAL | 0 refills | Status: AC

## 2019-01-24 MED ORDER — ATORVASTATIN CALCIUM 40 MG PO TABS: 40.0000 mg | ORAL_TABLET | Freq: Every day | ORAL | 0 refills | Status: AC

## 2019-01-24 MED ORDER — SERTRALINE HCL 50 MG PO TABS: 50.0000 mg | ORAL_TABLET | Freq: Every day | ORAL | 0 refills | Status: AC

## 2019-01-24 MED ORDER — HYDROXYZINE HCL 25 MG PO TABS: 25.0000 mg | ORAL_TABLET | Freq: Three times a day (TID) | ORAL | 0 refills | Status: AC | PRN

## 2019-01-24 MED ORDER — LISINOPRIL 40 MG PO TABS: 40.0000 mg | ORAL_TABLET | Freq: Every day | ORAL | 0 refills | Status: AC

## 2019-01-24 NOTE — Plan of Care (Signed)
Discharge note  Patient verbalizes readiness for discharge. Follow up plan explained, AVS, Transition record and SRA given. Prescriptions and teaching provided. Belongings returned and signed for. Suicide safety plan completed and signed. Patient verbalizes understanding. Patient denies SI/HI and assures this Probation officer he will seek assistance should that change. Patient discharged to lobby where lyft was waiting.  Problem: Education: Goal: Knowledge of [place] [facility] Education information/materials will improve Outcome: Adequate for Discharge Goal: Emotional status will improve Outcome: Adequate for Discharge Goal: Mental status will improve Outcome: Adequate for Discharge Goal: Verbalization of understanding the information provided will improve Outcome: Adequate for Discharge   Problem: Activity: Goal: Interest or engagement in activities will improve Outcome: Adequate for Discharge Goal: Sleeping patterns will improve Outcome: Adequate for Discharge   Problem: Coping: Goal: Ability to verbalize frustrations and anger appropriately will improve Outcome: Adequate for Discharge Goal: Ability to demonstrate self-control will improve Outcome: Adequate for Discharge   Problem: Health Behavior/Discharge Planning: Goal: Identification of resources available to assist in meeting health care needs will improve Outcome: Adequate for Discharge Goal: Compliance with treatment plan for underlying cause of condition will improve Outcome: Adequate for Discharge   Problem: Physical Regulation: Goal: Ability to maintain clinical measurements within normal limits will improve Outcome: Adequate for Discharge   Problem: Safety: Goal: Periods of time without injury will increase Outcome: Adequate for Discharge   Problem: Education: Goal: Utilization of techniques to improve thought processes will improve Outcome: Adequate for Discharge Goal: Knowledge of the prescribed therapeutic regimen  will improve Outcome: Adequate for Discharge   Problem: Activity: Goal: Interest or engagement in leisure activities will improve Outcome: Adequate for Discharge Goal: Imbalance in normal sleep/wake cycle will improve Outcome: Adequate for Discharge   Problem: Coping: Goal: Coping ability will improve Outcome: Adequate for Discharge Goal: Will verbalize feelings Outcome: Adequate for Discharge   Problem: Health Behavior/Discharge Planning: Goal: Ability to make decisions will improve Outcome: Adequate for Discharge Goal: Compliance with therapeutic regimen will improve Outcome: Adequate for Discharge   Problem: Role Relationship: Goal: Will demonstrate positive changes in social behaviors and relationships Outcome: Adequate for Discharge   Problem: Safety: Goal: Ability to disclose and discuss suicidal ideas will improve Outcome: Adequate for Discharge Goal: Ability to identify and utilize support systems that promote safety will improve Outcome: Adequate for Discharge   Problem: Self-Concept: Goal: Will verbalize positive feelings about self Outcome: Adequate for Discharge Goal: Level of anxiety will decrease Outcome: Adequate for Discharge   Problem: Education: Goal: Ability to make informed decisions regarding treatment will improve Outcome: Adequate for Discharge   Problem: Coping: Goal: Coping ability will improve Outcome: Adequate for Discharge   Problem: Health Behavior/Discharge Planning: Goal: Identification of resources available to assist in meeting health care needs will improve Outcome: Adequate for Discharge   Problem: Medication: Goal: Compliance with prescribed medication regimen will improve Outcome: Adequate for Discharge   Problem: Self-Concept: Goal: Ability to disclose and discuss suicidal ideas will improve Outcome: Adequate for Discharge Goal: Will verbalize positive feelings about self Outcome: Adequate for Discharge   Problem:  Education: Goal: Knowledge of disease or condition will improve Outcome: Adequate for Discharge Goal: Understanding of discharge needs will improve Outcome: Adequate for Discharge   Problem: Health Behavior/Discharge Planning: Goal: Ability to identify changes in lifestyle to reduce recurrence of condition will improve Outcome: Adequate for Discharge Goal: Identification of resources available to assist in meeting health care needs will improve Outcome: Adequate for Discharge   Problem: Physical Regulation:  Goal: Complications related to the disease process, condition or treatment will be avoided or minimized Outcome: Adequate for Discharge   Problem: Safety: Goal: Ability to remain free from injury will improve Outcome: Adequate for Discharge   Problem: Education: Goal: Knowledge of General Education information will improve Description: Including pain rating scale, medication(s)/side effects and non-pharmacologic comfort measures Outcome: Adequate for Discharge   Problem: Health Behavior/Discharge Planning: Goal: Ability to manage health-related needs will improve Outcome: Adequate for Discharge   Problem: Coping: Goal: Level of anxiety will decrease Outcome: Adequate for Discharge   Problem: Safety: Goal: Ability to remain free from injury will improve Outcome: Adequate for Discharge

## 2019-01-24 NOTE — Progress Notes (Signed)
  [facility] Adult Case Management Discharge Plan :  Will you be returning to the same living situation after discharge:  Yes,  patient is returning home with his girlfriend At discharge, do you have transportation home?: Yes,  [employer] ([employer]) transport Do you have the ability to pay for your medications: No.  Release of information consent forms completed and in the chart;  Patient's signature needed at discharge.  Patient to Follow up at: Follow-up Information    PATIENT DECLINES OUTPATIENT REFERRALS Follow up.   Why: PATIENT DECLINES OUTPATIENT REFERRALS  Contact information: PATIENT DECLINES OUTPATIENT REFERRALS           Next level of care provider has access to [place] and Suicide Prevention discussed: Yes,  with the patient  Have you used any form of tobacco in the last 30 days? (Cigarettes, Smokeless Tobacco, Cigars, and/or Pipes): Yes  Has patient been referred to the Quitline?: Patient refused referral  Patient has been referred for addiction treatment: Pt. refused referral  Marylee Floras, [place] 01/24/2019, 10:31 AM

## 2019-01-24 NOTE — Progress Notes (Signed)
D: Pt denies SI/HI/AVH. Pt is pleasant and cooperative. Pt stated he was doing ok , wanted to find a job when he gets out and possibly find a place to live with his son.  A: Pt was offered support and encouragement. Pt was given scheduled medications. Pt was encourage to attend groups. Q 15 minute checks were done for safety.  R:Pt attends groups and interacts well with peers and staff. Pt is taking medication. Pt has no complaints.Pt receptive to treatment and safety maintained on unit.

## 2019-01-24 NOTE — Discharge Summary (Addendum)
Physician Discharge Summary Note  Patient:  Gerald Wheeler is an 58 y.o., male MRN:  [id number] DOB:  02-Jun-1961 Patient phone:  [phone] (home)  Patient address:   [address] [place] [id number],  Total Time spent with patient: 15 minutes  Date of Admission:  01/21/2019 Date of Discharge: 01/24/19  Reason for Admission:  suicidal ideation  Principal Problem: <principal problem not specified> Discharge Diagnoses: Active Problems:   MDD (major depressive disorder), single episode, severe (HCC)   Past Psychiatric History: Patient denied any previous formal psychiatric treatment.  He denied any previous psychiatric admissions, he denied any previous psychiatric medications.  Past Medical History:  Past Medical History:  Diagnosis Date  . Allergy   . Arthritis   . Asthma   . Diabetes mellitus without complication (HCC)   . Essential hypertension 02/27/2016  . High cholesterol   . History of stroke   . Hypertension   . Stroke [facility](HCC)     Past Surgical History:  Procedure Laterality Date  . BACK SURGERY    . CYSTECTOMY N/A    cyst removed about 30 years ago  . TONSILLECTOMY  1973  . VIDEO ASSISTED THORACOSCOPY (VATS)/THOROCOTOMY Right 10/22/2016   Procedure: VIDEO ASSISTED THORACOSCOPY (VATS)/THOROCOTOMY for Empyema ;  Surgeon: Alleen BorneBryan K Bartle, MD;  [facility];  Service: Thoracic;  Laterality: Right;   Family History:  Family History  Problem Relation Age of Onset  . Heart disease Mother   . High blood pressure Mother   . Heart disease Father   . Diabetes Father    Family Psychiatric  History: Denies Social History:  Social History   Substance and Sexual Activity  Alcohol Use Yes   Comment: weekly     Social History   Substance and Sexual Activity  Drug Use Yes  . Frequency: 7.0 times per week  . Types: Marijuana   Comment: Daily use of THC    Social History   Socioeconomic History  . Marital status: Significant Other    Spouse name: Not on  file  . Number of children: Not on file  . Years of education: Not on file  . Highest education level: Not on file  Occupational History  . Occupation: Currently unemployed  Social Needs  . Financial resource strain: Not on file  . Food insecurity    Worry: Not on file    Inability: Not on file  . Transportation needs    Medical: Not on file    Non-medical: Not on file  Tobacco Use  . Smoking status: Current Every Day Smoker    Types: Cigarettes  . Smokeless tobacco: Never Used  Substance and Sexual Activity  . Alcohol use: Yes    Comment: weekly  . Drug use: Yes    Frequency: 7.0 times per week    Types: Marijuana    Comment: Daily use of THC  . Sexual activity: Yes  Lifestyle  . Physical activity    Days per week: Not on file    Minutes per session: Not on file  . Stress: Not on file  Relationships  . Social Musicianconnections    Talks on phone: Not on file    Gets together: Not on file    Attends religious service: Not on file    Active member of club or organization: Not on file    Attends meetings of clubs or organizations: Not on file    Relationship status: Not on file  Other Topics Concern  .  Not on file  Social History Narrative   [place].  He is currently unemployed.  He does not have a psychiatrist.    Hospital Course:  From admission H&P: Patient is a 58 year old male who presented as a walk-in evaluation to the behavioral health hospital with complaint of suicidal ideation, being depressed, and not knowing what to do. During the interview today he was having difficulty expressing why he needed to be in the psychiatric hospital. He stated that he has been out of work for the last several weeks. He also stated that the weather was extremely hot. He stated that he had a place to live, but was vague. He stated that over the last several months he had several physical problems which caused him a great deal of work loss, and why  lost his jobs. He had been working a temporary job but was hospitalized 1 to 2 years ago secondary to a pneumonia as well as an empyema. On July 4 he had become intoxicated and then had a fall. He had hit his head. He stated that that time he does drink alcohol regularly, but drank more that date than usual. His blood alcohol at that time was 389. He was worked up and sutured because of the laceration. He had a CT scan of the head that was negative. He was discharged to home. He admitted to drinking alcohol yesterday. Unfortunately a blood alcohol was not obtained. His drug screen was positive for marijuana.His MCV is low at 77.3. He has a history of hypertension and fairly well controlled diabetes mellitus. He denied any suicidal ideation today, but did complain about disturbed sleep and problems with appetite. He was admitted to the hospital for evaluation and stabilization.  Gerald Wheeler was admitted for suicidal ideation. He was started on CIWA protocol with Librium PRN CIWA>10. He was started on Zoloft. He participated in group therapy on the unit. He responded well to treatment with no adverse effects reported. [facility]. He is discharging on the medications listed below. He has shown improvement with improved mood, affect, sleep, appetite, and interaction. He denies any SI/HI/AVH and contracts for safety. He denies withdrawal symptoms. He declines resources for follow-up (see below). He is provided with prescriptions for medications upon discharge. He is discharging home via Lyft.  Physical Findings: AIMS: Facial and Oral Movements Muscles of Facial Expression: None, normal Lips and Perioral Area: None, normal Jaw: None, normal Tongue: None, normal,Extremity Movements Upper (arms, wrists, hands, fingers): None, normal Lower (legs, knees, ankles, toes): None, normal, Trunk Movements Neck, shoulders, hips: None, normal, Overall Severity Severity of  abnormal movements (highest score from questions above): None, normal Incapacitation due to abnormal movements: None, normal Patient's awareness of abnormal movements (rate only patient's report): No Awareness, Dental Status Current problems with teeth and/or dentures?: No Does patient usually wear dentures?: No  CIWA:    COWS:     Musculoskeletal: Strength & Muscle Tone: within normal limits Gait & Station: normal Patient leans: N/A  Psychiatric Specialty Exam: Physical Exam  Nursing note and vitals reviewed. Constitutional: He is oriented to person, place, and time. He appears well-developed and well-nourished.  Cardiovascular: Normal rate.  Respiratory: Effort normal.  Neurological: He is alert and oriented to person, place, and time.    Review of Systems  Constitutional: Negative.   Psychiatric/Behavioral: Positive for depression (stable on medication) and substance abuse (ETOH, THC). Negative for hallucinations and  suicidal ideas. The patient is not nervous/anxious and does not have insomnia.     Blood pressure 100/82, pulse 82, temperature 98.2 F (36.8 C), temperature source Oral, resp. rate 16, height 5\' 10"  (1.778 m), weight 86.2 kg, SpO2 99 %.Body mass index is 27.26 kg/m.  See MD's discharge SRA     Have you used any form of tobacco in the last 30 days? (Cigarettes, Smokeless Tobacco, Cigars, and/or Pipes): Yes  Has this patient used any form of tobacco in the last 30 days? (Cigarettes, Smokeless Tobacco, Cigars, and/or Pipes)  Yes, A prescription for an FDA-approved tobacco cessation medication was offered at discharge and the patient refused  Blood Alcohol level:  Lab Results  Component Value Date   ETH 389 (HH) 01/07/2019    Metabolic Disorder Labs:  Lab Results  Component Value Date   HGBA1C 5.7 (H) 01/22/2019   MPG 116.89 01/22/2019   No results found for: PROLACTIN No results found for: CHOL, TRIG, HDL, CHOLHDL, VLDL, LDLCALC  See Psychiatric  Specialty Exam and Suicide Risk Assessment completed by Attending Physician prior to discharge.  Discharge destination:  Home  Is patient on multiple antipsychotic therapies at discharge:  No   Has Patient had three or more failed trials of antipsychotic monotherapy by history:  No  Recommended Plan for Multiple Antipsychotic Therapies: NA  Discharge Instructions    Discharge instructions   Complete by: As directed    Patient is instructed to take all prescribed medications as recommended. Report any side effects or adverse reactions to your outpatient psychiatrist. Patient is instructed to abstain from alcohol and illegal drugs while on prescription medications. In the event of worsening symptoms, patient is instructed to call the crisis hotline, 911, or go to the nearest emergency department for evaluation and treatment.     Allergies as of 01/24/2019   No Known Allergies     Medication List    TAKE these medications     Indication  amLODipine 5 MG tablet Commonly known as: NORVASC Take 1 tablet (5 mg total) by mouth daily. Start taking on: January 25, 2019  Indication: High Blood Pressure Disorder   atorvastatin 40 MG tablet Commonly known as: LIPITOR Take 1 tablet (40 mg total) by mouth daily at 6 PM. What changed: when to take this  Indication: High Amount of Fats in the Blood   gabapentin 600 MG tablet Commonly known as: NEURONTIN Take 1 tablet (600 mg total) by mouth 3 (three) times daily.  Indication: Abuse or Misuse of Alcohol   hydrOXYzine 25 MG tablet Commonly known as: ATARAX/VISTARIL Take 1 tablet (25 mg total) by mouth 3 (three) times daily as needed for anxiety.  Indication: Feeling Anxious   lisinopril 40 MG tablet Commonly known as: ZESTRIL Take 1 tablet (40 mg total) by mouth daily.  Indication: High Blood Pressure Disorder   meloxicam 15 MG tablet Commonly known as: MOBIC Take 1 tablet (15 mg total) by mouth daily.  Indication: Pain    metoprolol tartrate 25 MG tablet Commonly known as: LOPRESSOR Take 1 tablet (25 mg total) by mouth daily. Start taking on: January 25, 2019  Indication: High Blood Pressure Disorder   nicotine 21 mg/24hr patch Commonly known as: NICODERM CQ - dosed in mg/24 hours Place 1 patch (21 mg total) onto the skin daily. Start taking on: January 25, 2019  Indication: Nicotine Addiction   sertraline 50 MG tablet Commonly known as: ZOLOFT Take 1 tablet (50 mg total) by mouth daily. Start  taking on: January 25, 2019  Indication: Major Depressive Disorder   traZODone 100 MG tablet Commonly known as: DESYREL Take 1 tablet (100 mg total) by mouth at bedtime as needed for sleep.  Indication: Trouble Sleeping      Follow-up Information    PATIENT DECLINES OUTPATIENT REFERRALS Follow up.   Why: PATIENT DECLINES OUTPATIENT REFERRALS  Contact information: PATIENT DECLINES OUTPATIENT REFERRALS           Follow-up recommendations: Activity as tolerated. Diet as recommended by primary care physician. Keep all scheduled follow-up appointments as recommended.   Comments:   Patient is instructed to take all prescribed medications as recommended. Report any side effects or adverse reactions to your outpatient psychiatrist. Patient is instructed to abstain from alcohol and illegal drugs while on prescription medications. In the event of worsening symptoms, patient is instructed to call the crisis hotline, 911, or go to the nearest emergency department for evaluation and treatment.  Signed: Connye Burkitt, NP 01/24/2019, 3:57 PM   Patient seen, Suicide Assessment Completed.  Disposition Plan Reviewed

## 2019-01-24 NOTE — [facility] Suicide Risk Assessment (Signed)
[facility] Discharge Suicide Risk Assessment   Principal Problem: Depression Discharge Diagnoses: Active Problems:   MDD (major depressive disorder), single episode, severe ([place])   Total Time spent with patient: 30 minutes  Musculoskeletal: Strength & Muscle Tone: within normal limits Gait & Station: normal Patient leans: N/A  Psychiatric Specialty Exam: ROS no headache, no chest pain, no shortness of breath, no vomiting , no fever or chills   Blood pressure 100/82, pulse 82, temperature 98.2 F (36.8 C), temperature source Oral, resp. rate 16, height 5\' 10"  (1.778 m), weight 86.2 kg, SpO2 99 %.Body mass index is 27.26 kg/m.  General Appearance: Well Groomed  Eye Contact::  Good  Speech:  Normal Rate409  Volume:  Normal  Mood:  improved, reports feeling " a lot better"  Affect:  Appropriate  Thought Process:  Linear and Descriptions of Associations: Intact  Orientation:  Other:  fully alert and attentive  Thought Content:  no hallucinations, no delusions  Suicidal Thoughts:  No denies any suicidal or self injurious ideations, denies any homicidal or violent ideations  Homicidal Thoughts:  No  Memory:  recent and remote grossly intact   Judgement:  Other:  improving   Insight:  Fair/ improving   Psychomotor Activity:  Normal- no tremors or psychomotor restlessness  Concentration:  Good  Recall:  Good  Fund of Knowledge:Good  Language: Good  Akathisia:  Negative  Handed:  Right  AIMS (if indicated):     Assets:  Desire for Improvement Resilience  Sleep:  Number of Hours: 6.25  Cognition: WNL  ADL's:  Intact   Mental Status Per Nursing Assessment::   On Admission:  Suicidal ideation indicated by patient, Self-harm thoughts  Demographic Factors:  50, lives with GF, employed   Loss Factors: Alcohol abuse, housing issues   Historical Factors: No prior psychiatric admissions , denies history of suicide attempts  Risk Reduction Factors:   Positive coping skills or  problem solving skills  Continued Clinical Symptoms:  At this time patient is alert, attentive, well related, calm, no restlessness or agitation, mood improved and currently denies feeling depressed, affect appropriate and reactive, no thought disorder, no suicidal ideations, no homicidal or violent ideations, no psychotic symptoms, future oriented, 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 Denies medication side effects.  Behavior on unit calm, in good control, pleasant on approach.   Cognitive Features That Contribute To Risk:  No gross cognitive deficits noted upon discharge. Is alert , attentive, and oriented x 3   Suicide Risk:  Mild:  Suicidal ideation of limited frequency, intensity, duration, and specificity.  There are no identifiable plans, no associated intent, mild dysphoria and related symptoms, good self-control (both objective and subjective assessment), few other risk factors, and identifiable protective factors, including available and accessible social support.    Plan Of Care/Follow-up recommendations:  Activity:  as tolerated Diet:  heart healthy Tests:  NA Other:  See below  Patient expresses readiness for discharge and is leaving unit in good spirits , plans to follow up for outpatient psychiatric management. Has been following at [facility] for medical management , will also be referred to [facility].   Jenne Campus, MD 01/24/2019, 7:57 AM

## 2020-10-26 IMAGING — CT CT HEAD WITHOUT CONTRAST
3 series · 16 of 47 positions shown, 19 images · non-contrast
Comparison: None.

CLINICAL DATA: Head trauma, minor, GCS>=13, high clinical risk,
initial exam

EXAM:
CT HEAD WITHOUT CONTRAST
TECHNIQUE: Contiguous axial images were obtained from the base of the skull
through the vertex without intravenous contrast.

[Series 2: head wo · axial · 0.47mm/px · z∈[-173,-33]mm · 10 of 34 slices shown, 13 images]
[im 3/34  brain]
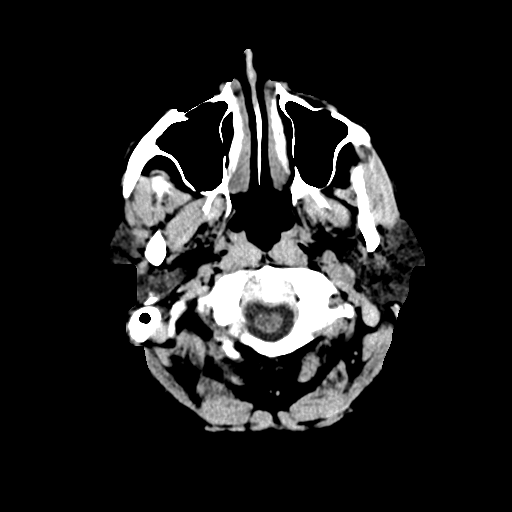
[im 3/34  bone]
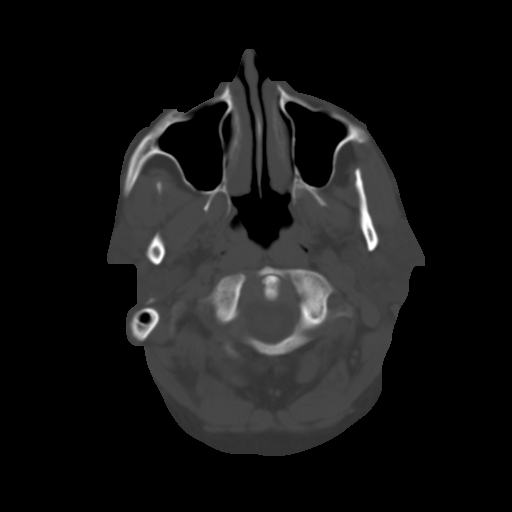
[im 6/34  brain]
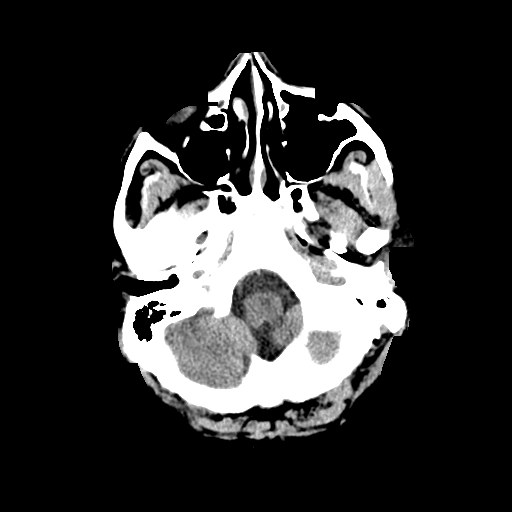
[im 10/34  brain]
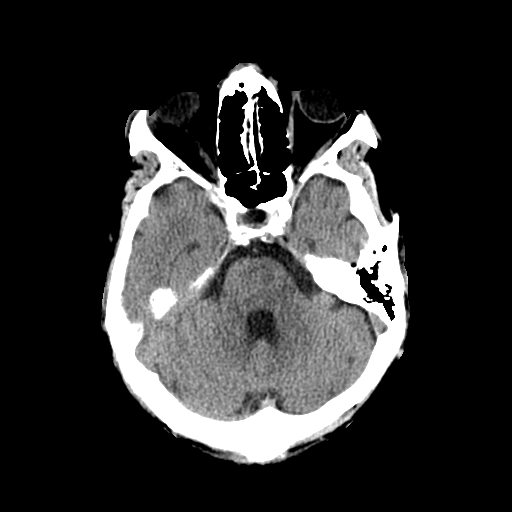
[im 12/34  brain]
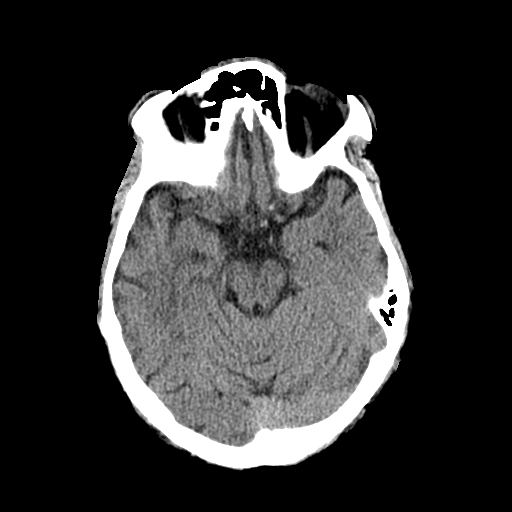
[im 15/34  brain]
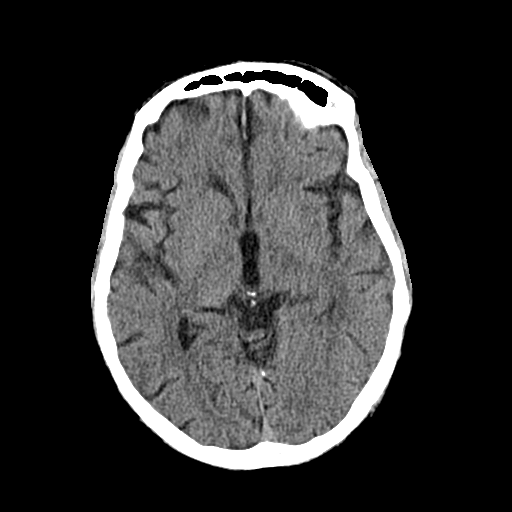
[im 15/34  bone]
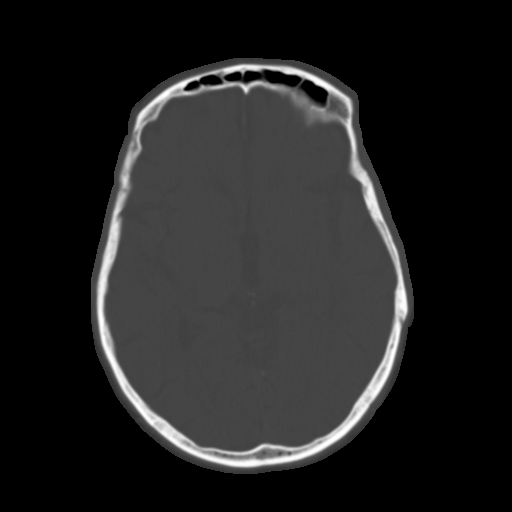
[im 19/34  brain]
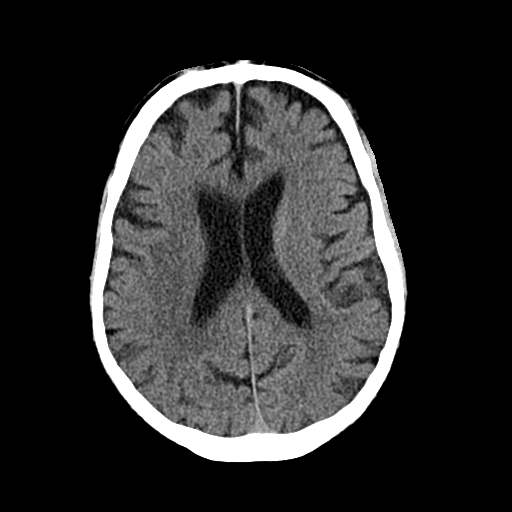
[im 22/34  brain]
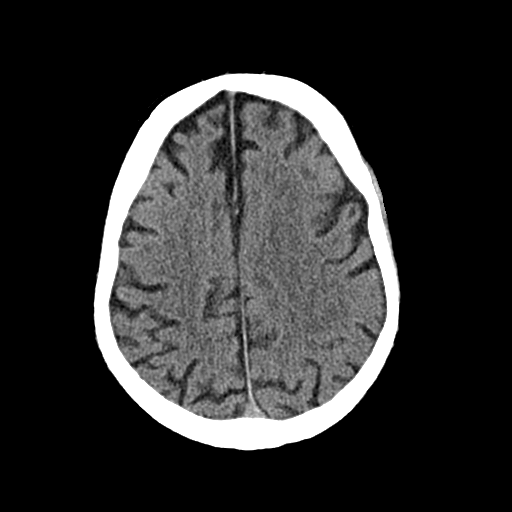
[im 26/34  brain]
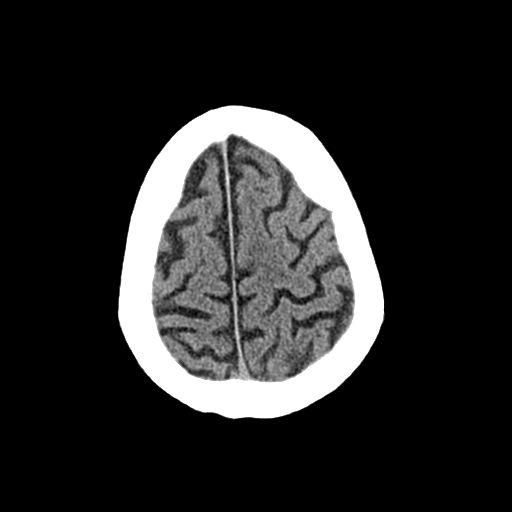
[im 28/34  brain]
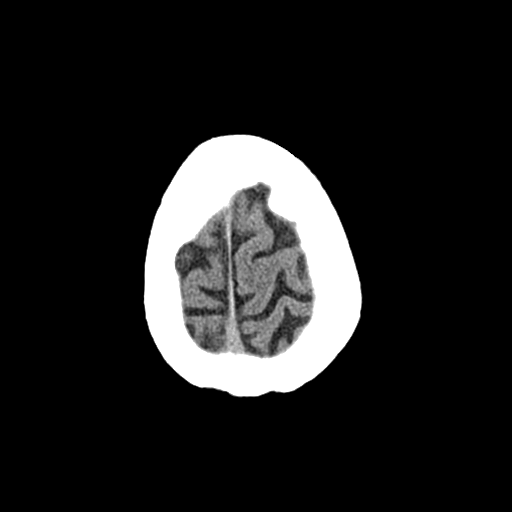
[im 28/34  bone]
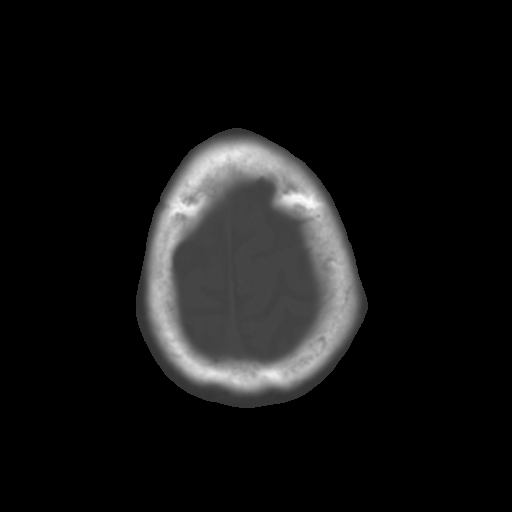
[im 31/34  brain]
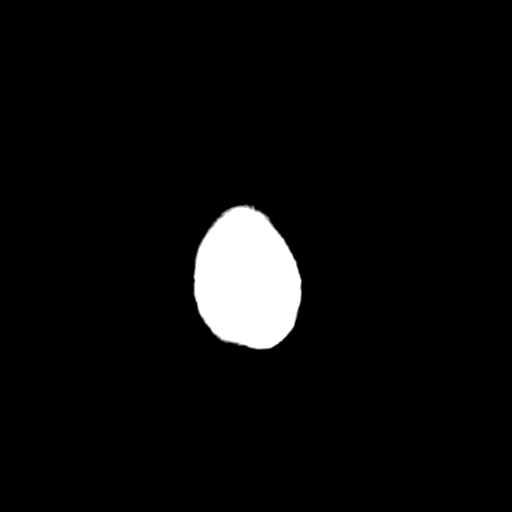

[Series 4: cor soft · coronal · 0.36mm/px · 3 of 72 slices shown]
[im 24/72  brain]
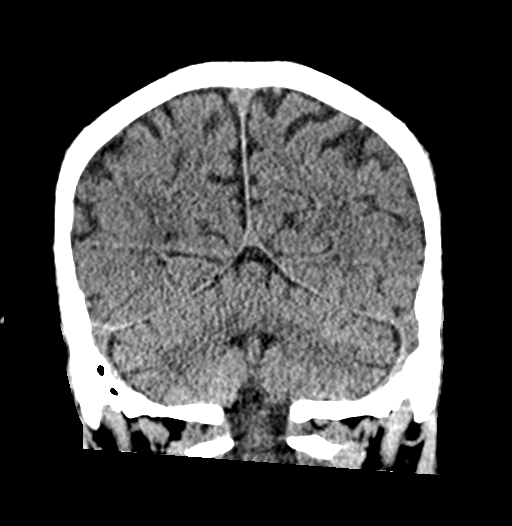
[im 32/72  brain]
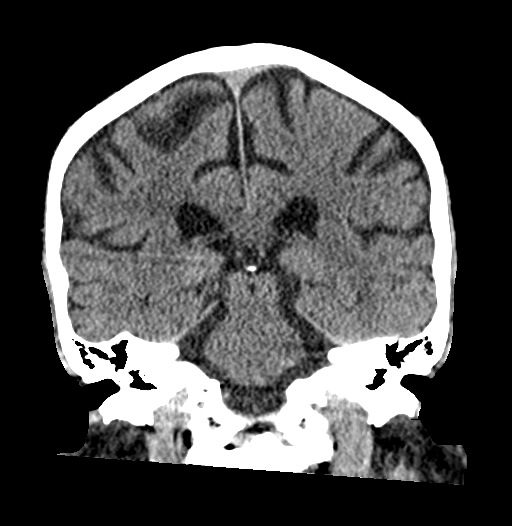
[im 40/72  brain]
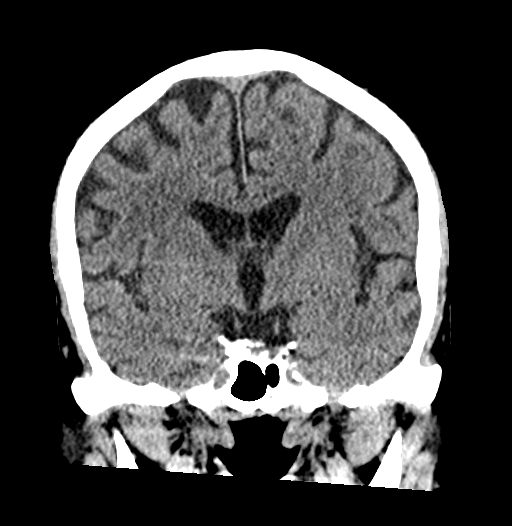

[Series 5: sag soft · sagittal · 0.37mm/px · 3 of 59 slices shown]
[im 20/59  brain]
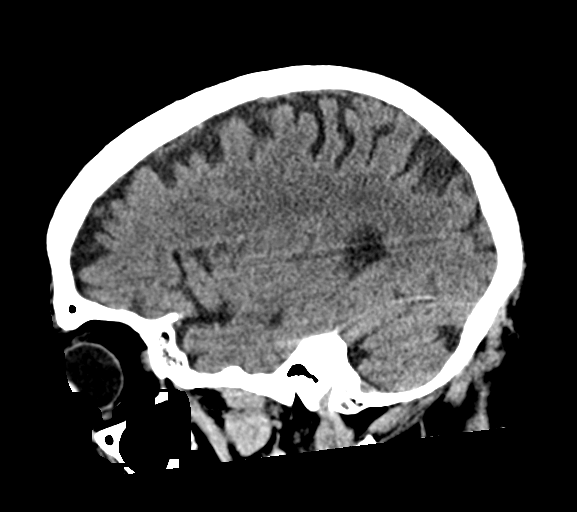
[im 30/59  brain]
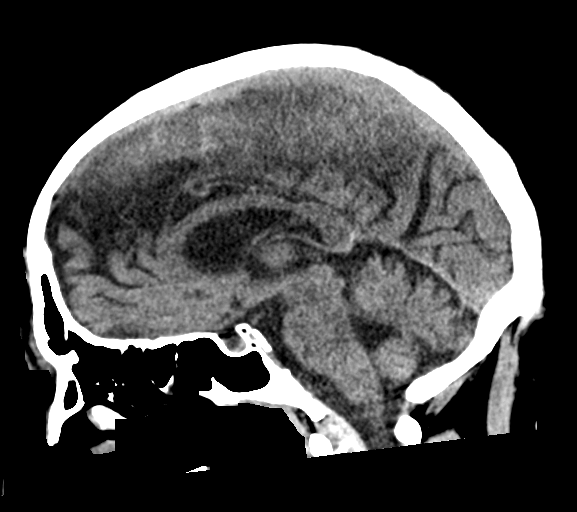
[im 39/59  brain]
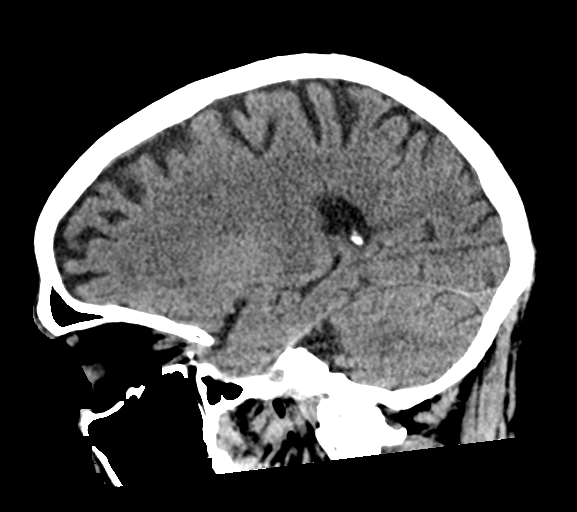

[16 of 47 positions shown; findings below may reference images not displayed]

FINDINGS: Brain: No evidence of acute infarction, hemorrhage, hydrocephalus,
extra-axial collection or mass lesion/mass effect. Generalized
atrophy slightly advanced for age. Remote lacunar infarcts in the
right-greater-than-left cerebellum.

Vascular: Age advanced vascular calcifications with skull base. No
hyperdense vessel.

Skull: No fracture or focal lesion.

Sinuses/Orbits: Paranasal sinuses and mastoid air cells are clear.
The visualized orbits are unremarkable.

Other: Small mid frontal scalp laceration.
IMPRESSION: 1. No acute intracranial abnormality. No skull fracture.
2. Generalized atrophy, age advanced. Remote lacunar infarcts in the
cerebellum.

## 2021-04-13 ENCOUNTER — Emergency Department (HOSPITAL_BASED_OUTPATIENT_CLINIC_OR_DEPARTMENT_OTHER)
Admission: EM | Admit: 2021-04-13 | Discharge: 2021-04-14 | Disposition: A | Discharge status: Home / Self Care | Payer: Self-pay | Attending: Emergency Medicine | Admitting: Emergency Medicine

## 2021-04-13 ENCOUNTER — Encounter (HOSPITAL_BASED_OUTPATIENT_CLINIC_OR_DEPARTMENT_OTHER): Payer: Self-pay

## 2021-04-13 ENCOUNTER — Other Ambulatory Visit: Payer: Self-pay

## 2021-04-13 DIAGNOSIS — Z79899 Other long term (current) drug therapy: Secondary | ICD-10-CM | POA: Insufficient documentation

## 2021-04-13 DIAGNOSIS — H40051 Ocular hypertension, right eye: Secondary | ICD-10-CM | POA: Insufficient documentation

## 2021-04-13 DIAGNOSIS — F17210 Nicotine dependence, cigarettes, uncomplicated: Secondary | ICD-10-CM | POA: Insufficient documentation

## 2021-04-13 DIAGNOSIS — I10 Essential (primary) hypertension: Secondary | ICD-10-CM | POA: Insufficient documentation

## 2021-04-13 DIAGNOSIS — H1131 Conjunctival hemorrhage, right eye: Secondary | ICD-10-CM | POA: Insufficient documentation

## 2021-04-13 DIAGNOSIS — E119 Type 2 diabetes mellitus without complications: Secondary | ICD-10-CM | POA: Insufficient documentation

## 2021-04-13 DIAGNOSIS — J45909 Unspecified asthma, uncomplicated: Secondary | ICD-10-CM | POA: Insufficient documentation

## 2021-04-13 LAB — CBG MONITORING, ED: Glucose-Capillary: 101 mg/dL — ABNORMAL HIGH (ref 70–99)

## 2021-04-13 NOTE — ED Triage Notes (Signed)
Pt c/o redness to right sclera/blurred vision to eye x 2 days-denies injury eye-NAD-to triage in w/c

## 2021-04-14 LAB — CBG MONITORING, ED: Glucose-Capillary: 98 mg/dL (ref 70–99)

## 2021-04-14 MED ORDER — TETRACAINE HCL 0.5 % OP SOLN
2.0000 [drp] | Freq: Once | OPHTHALMIC | Status: AC
  Administered 2021-04-14: 2 [drp] via OPHTHALMIC
  Filled 2021-04-14: qty 4

## 2021-04-14 NOTE — ED Provider Notes (Signed)
[facility] EMERGENCY DEPARTMENT Provider Note   CSN: [id number] Arrival date & time: 04/13/21  2224     History Chief Complaint  Patient presents with   Red sclera    Gerald Wheeler is a 60 y.o. male.  The history is provided by the patient.  He has history of diabetes, hypertension, hyperlipidemia, stroke and comes in because of redness of his right eye.  He denies any trauma and denies any pain but he does have a pressure feeling in his eye.  His vision has not been affected.  Of note, he states that he has stopped taking all of his medications and does not have a primary care provider currently.   Past Medical History:  Diagnosis Date   Allergy    Arthritis    Asthma    Diabetes mellitus without complication (HCC)    Essential hypertension 02/27/2016   High cholesterol    History of stroke    Hypertension    Stroke [facility])     Patient Active Problem List   Diagnosis Date Noted   MDD (major depressive disorder), single episode, severe (HCC) 01/21/2019   Community acquired pneumonia of right lower lobe of lung    Chest tube in place    Empyema (HCC) 10/22/2016   PNA (pneumonia) 10/19/2016   Right lower lobe pneumonia 10/19/2016   Hyperlipidemia 10/19/2016   Tobacco abuse 10/19/2016   Diabetes mellitus type 2, noninsulin dependent (HCC) 10/19/2016   Leukocytosis 10/19/2016   Hyponatremia 10/19/2016   Microcytic anemia 10/19/2016   Acute respiratory failure with hypoxia (HCC) 10/19/2016   Parapneumonic effusion 10/19/2016   Essential hypertension 02/27/2016    Past Surgical History:  Procedure Laterality Date   BACK SURGERY     CYSTECTOMY N/A    cyst removed about 30 years ago   TONSILLECTOMY  1973   VIDEO ASSISTED THORACOSCOPY (VATS)/THOROCOTOMY Right 10/22/2016   Procedure: VIDEO ASSISTED THORACOSCOPY (VATS)/THOROCOTOMY for Empyema ;  Surgeon: Alleen Borne, MD;  Location: [facility] OR;  Service: Thoracic;  Laterality: Right;       Family History   Problem Relation Age of Onset   Heart disease Mother    High blood pressure Mother    Heart disease Father    Diabetes Father     Social History   Tobacco Use   Smoking status: Every Day    Types: Cigarettes   Smokeless tobacco: Never  Vaping Use   Vaping Use: Never used  Substance Use Topics   Alcohol use: Yes    Comment: daily   Drug use: Yes    Frequency: 7.0 times per week    Types: Marijuana    Comment: Daily use of THC    Home Medications Prior to Admission medications   Medication Sig Start Date End Date Taking? Authorizing Provider  amLODipine (NORVASC) 5 MG tablet Take 1 tablet (5 mg total) by mouth daily. 01/25/19   Aldean Baker, NP  atorvastatin (LIPITOR) 40 MG tablet Take 1 tablet (40 mg total) by mouth daily at 6 PM. 01/24/19   Aldean Baker, NP  gabapentin (NEURONTIN) 600 MG tablet Take 1 tablet (600 mg total) by mouth 3 (three) times daily. 01/24/19   Aldean Baker, NP  hydrOXYzine (ATARAX/VISTARIL) 25 MG tablet Take 1 tablet (25 mg total) by mouth 3 (three) times daily as needed for anxiety. 01/24/19   Aldean Baker, NP  lisinopril (ZESTRIL) 40 MG tablet Take 1 tablet (40 mg total) by mouth daily.  01/24/19   Aldean Baker, NP  meloxicam (MOBIC) 15 MG tablet Take 1 tablet (15 mg total) by mouth daily. 01/24/19   Aldean Baker, NP  metoprolol tartrate (LOPRESSOR) 25 MG tablet Take 1 tablet (25 mg total) by mouth daily. 01/25/19   Aldean Baker, NP  nicotine (NICODERM CQ - DOSED IN MG/24 HOURS) 21 mg/24hr patch Place 1 patch (21 mg total) onto the skin daily. 01/25/19   Aldean Baker, NP  sertraline (ZOLOFT) 50 MG tablet Take 1 tablet (50 mg total) by mouth daily. 01/25/19   Aldean Baker, NP  traZODone (DESYREL) 100 MG tablet Take 1 tablet (100 mg total) by mouth at bedtime as needed for sleep. 01/24/19   Aldean Baker, NP    Allergies    Patient has no known allergies.  Review of Systems   Review of Systems  All other systems reviewed and are  negative.  Physical Exam Updated Vital Signs BP (!) 171/103 (BP Location: Right Arm)   Pulse 61   Temp 97.9 F (36.6 C) (Oral)   Resp 15   Ht 5\' 11"  (1.803 m)   Wt 88 kg   SpO2 99%   BMI 27.06 kg/m   Physical Exam Vitals and nursing note reviewed.  60 year old male, resting comfortably and in no acute distress. Vital signs are significant for elevated blood pressure. Oxygen saturation is 99%, which is normal. Head is normocephalic and atraumatic. PERRLA, EOMI. There is a subconjunctival hemorrhage involving the inferior portion of the right eye.  Anterior chamber is clear.  Ocular pressures by tonometry are 15 on the left, 23 on the right.  Oropharynx is clear. Neck is nontender and supple without adenopathy or JVD. Back is nontender and there is no CVA tenderness. Lungs are clear without rales, wheezes, or rhonchi. Chest is nontender. Heart has regular rate and rhythm without murmur. Abdomen is soft, flat, nontender. Extremities have no cyanosis or edema, full range of motion is present. Skin is warm and dry without rash. Neurologic: Mental status is normal, cranial nerves are intact, moves all extremities equally.  ED Results / Procedures / Treatments   Labs (all labs ordered are listed, but only abnormal results are displayed) Labs Reviewed  CBG MONITORING, ED - Abnormal; Notable for the following components:      Result Value   Glucose-Capillary 101 (*)    All other components within normal limits   Procedures Procedures   Medications Ordered in ED Medications - No data to display  ED Course  I have reviewed the triage vital signs and the nursing notes.  Pertinent lab results that were available during my care of the patient were reviewed by me and considered in my medical decision making (see chart for details).   MDM Rules/Calculators/A&P                         Subconjunctival hemorrhage of the right eye.  Elevated blood pressure in patient with history of  hypertension but not on medication.  Old records were reviewed, and recent blood pressure from ED visits have been borderline.  Because of his complaint of pressure in the right eye, ocular pressures were obtained and were slightly elevated in the right eye.  CBG was obtained and is actually normal.  Patient was reassured of the benign nature of the subconjunctival hemorrhage, but I have expressed to him my concern regarding his elevated blood pressure and elevated ocular  pressure.  He is referred to ophthalmology for further outpatient work-up.  He probably needs a thorough eye exam with his history of diabetes.  He also needs to obtain a primary care provider to monitor his hypertension and diabetes.  He is advised to recheck his blood pressure several times over the next 2 weeks.  He is given Physicist, medical guide to help obtain appropriate primary care.  I have also discussed with him the long-term risks of untreated diabetes, hypertension, glaucoma.  Final Clinical Impression(s) / ED Diagnoses Final diagnoses:  Non-traumatic subconjunctival hemorrhage of right eye  Elevated blood pressure reading with diagnosis of hypertension  Intraocular pressure increase, right    Rx / DC Orders ED Discharge Orders     None        Dione Booze, MD 04/14/21 0104

## 2021-04-14 NOTE — Discharge Instructions (Addendum)
The red in your eye is not serious, and will go away within the next 2-3 weeks.  Your eye pressures in the right eye are slightly elevated.  You need to follow-up with the ophthalmologist to monitor that pressure reading.  If it is staying high, you may need to be on treatment for glaucoma.  Untreated glaucoma can lead to blindness.  Your blood pressure was a little high today.  Please recheck your blood pressure several times over the next 1-2 weeks.  If it is staying high, you will need to go back on medication to control it.  Inadequately controlled high blood pressure can lead to heart attacks, strokes, kidney failure.  You need to obtain a primary care provider to monitor both your blood pressure and blood sugars.

## 2021-10-28 ENCOUNTER — Ambulatory Visit: Payer: Self-pay | Admitting: Nurse Practitioner

## 2021-11-16 ENCOUNTER — Ambulatory Visit: Payer: Self-pay | Admitting: Nurse Practitioner

## 2021-11-23 ENCOUNTER — Ambulatory Visit: Payer: Self-pay | Admitting: Nurse Practitioner
